# Patient Record
Sex: Male | Born: 1970 | Race: White | Hispanic: No | Marital: Single | Smoking: Never smoker
Health system: Southern US, Community
[De-identification: ages and names within clinical notes are randomized; demographics above are authoritative.]

## PROBLEM LIST (undated history)

## (undated) DIAGNOSIS — K5792 Diverticulitis of intestine, part unspecified, without perforation or abscess without bleeding: Secondary | ICD-10-CM

## (undated) DIAGNOSIS — E785 Hyperlipidemia, unspecified: Secondary | ICD-10-CM

## (undated) DIAGNOSIS — Z801 Family history of malignant neoplasm of trachea, bronchus and lung: Secondary | ICD-10-CM

## (undated) HISTORY — DX: Family history of malignant neoplasm of trachea, bronchus and lung: Z80.1

---

## 1999-06-20 HISTORY — PX: COLON SURGERY: SHX602

## 2015-08-31 ENCOUNTER — Encounter (HOSPITAL_COMMUNITY): Payer: Self-pay | Admitting: *Deleted

## 2015-08-31 MED ORDER — CEFAZOLIN SODIUM-DEXTROSE 2-3 GM-% IV SOLR: 2.0000 g | INTRAVENOUS | Status: DC

## 2015-09-01 ENCOUNTER — Encounter (HOSPITAL_COMMUNITY): Payer: Self-pay | Admitting: *Deleted

## 2015-09-01 ENCOUNTER — Ambulatory Visit (HOSPITAL_COMMUNITY)
Admission: RE | Admit: 2015-09-01 | Discharge: 2015-09-01 | Disposition: A | Discharge status: Home / Self Care | Payer: BLUE CROSS/BLUE SHIELD | Source: Ambulatory Visit | Attending: Orthopedic Surgery | Admitting: Orthopedic Surgery

## 2015-09-01 ENCOUNTER — Ambulatory Visit (HOSPITAL_COMMUNITY): Payer: BLUE CROSS/BLUE SHIELD | Admitting: Anesthesiology

## 2015-09-01 ENCOUNTER — Encounter (HOSPITAL_COMMUNITY)
Admission: RE | Disposition: A | Discharge status: Home / Self Care | Payer: Self-pay | Source: Ambulatory Visit | Attending: Orthopedic Surgery

## 2015-09-01 DIAGNOSIS — X58XXXA Exposure to other specified factors, initial encounter: Secondary | ICD-10-CM | POA: Diagnosis not present

## 2015-09-01 DIAGNOSIS — Z79899 Other long term (current) drug therapy: Secondary | ICD-10-CM | POA: Insufficient documentation

## 2015-09-01 DIAGNOSIS — S62212A Bennett's fracture, left hand, initial encounter for closed fracture: Secondary | ICD-10-CM | POA: Diagnosis present

## 2015-09-01 DIAGNOSIS — E785 Hyperlipidemia, unspecified: Secondary | ICD-10-CM | POA: Diagnosis not present

## 2015-09-01 HISTORY — DX: Hyperlipidemia, unspecified: E78.5

## 2015-09-01 HISTORY — PX: CLOSED REDUCTION FINGER WITH PERCUTANEOUS PINNING: SHX5612

## 2015-09-01 HISTORY — DX: Diverticulitis of intestine, part unspecified, without perforation or abscess without bleeding: K57.92

## 2015-09-01 SURGERY — CLOSED REDUCTION, FINGER, WITH PERCUTANEOUS PINNING
Anesthesia: General | Site: Thumb | Laterality: Left

## 2015-09-01 MED ORDER — HYDROMORPHONE HCL 1 MG/ML IJ SOLN: 0.2500 mg | INTRAMUSCULAR | Status: DC | PRN

## 2015-09-01 MED ORDER — FENTANYL CITRATE (PF) 250 MCG/5ML IJ SOLN
INTRAMUSCULAR | Status: AC
  Filled 2015-09-01: qty 5

## 2015-09-01 MED ORDER — CEFAZOLIN SODIUM-DEXTROSE 2-3 GM-% IV SOLR
INTRAVENOUS | Status: DC | PRN
  Administered 2015-09-01: 2 g via INTRAVENOUS

## 2015-09-01 MED ORDER — FENTANYL CITRATE (PF) 100 MCG/2ML IJ SOLN
INTRAMUSCULAR | Status: DC | PRN
  Administered 2015-09-01 (×2): 50 ug via INTRAVENOUS
  Administered 2015-09-01 (×2): 25 ug via INTRAVENOUS
  Administered 2015-09-01: 50 ug via INTRAVENOUS
  Administered 2015-09-01 (×2): 25 ug via INTRAVENOUS

## 2015-09-01 MED ORDER — PROMETHAZINE HCL 25 MG/ML IJ SOLN: 6.2500 mg | INTRAMUSCULAR | Status: DC | PRN

## 2015-09-01 MED ORDER — PROPOFOL 10 MG/ML IV BOLUS
INTRAVENOUS | Status: DC | PRN
  Administered 2015-09-01: 200 mg via INTRAVENOUS

## 2015-09-01 MED ORDER — ONDANSETRON HCL 4 MG/2ML IJ SOLN
INTRAMUSCULAR | Status: AC
  Filled 2015-09-01: qty 2

## 2015-09-01 MED ORDER — LACTATED RINGERS IV SOLN
INTRAVENOUS | Status: DC | PRN
  Administered 2015-09-01 (×2): via INTRAVENOUS

## 2015-09-01 MED ORDER — PHENYLEPHRINE HCL 10 MG/ML IJ SOLN
INTRAMUSCULAR | Status: DC | PRN
  Administered 2015-09-01: 40 ug via INTRAVENOUS
  Administered 2015-09-01: 80 ug via INTRAVENOUS
  Administered 2015-09-01 (×7): 40 ug via INTRAVENOUS

## 2015-09-01 MED ORDER — HYDROMORPHONE HCL 2 MG PO TABS: 2.0000 mg | ORAL_TABLET | ORAL | Status: DC | PRN

## 2015-09-01 MED ORDER — MIDAZOLAM HCL 2 MG/2ML IJ SOLN
INTRAMUSCULAR | Status: AC
  Filled 2015-09-01: qty 2

## 2015-09-01 MED ORDER — 0.9 % SODIUM CHLORIDE (POUR BTL) OPTIME
TOPICAL | Status: DC | PRN
  Administered 2015-09-01: 1000 mL

## 2015-09-01 MED ORDER — MEPERIDINE HCL 25 MG/ML IJ SOLN: 6.2500 mg | INTRAMUSCULAR | Status: DC | PRN

## 2015-09-01 MED ORDER — LACTATED RINGERS IV SOLN: INTRAVENOUS | Status: DC

## 2015-09-01 MED ORDER — BUPIVACAINE-EPINEPHRINE (PF) 0.5% -1:200000 IJ SOLN
INTRAMUSCULAR | Status: DC | PRN
  Administered 2015-09-01: 30 mL via PERINEURAL

## 2015-09-01 MED ORDER — PHENYLEPHRINE 40 MCG/ML (10ML) SYRINGE FOR IV PUSH (FOR BLOOD PRESSURE SUPPORT)
PREFILLED_SYRINGE | INTRAVENOUS | Status: AC
  Filled 2015-09-01: qty 10

## 2015-09-01 MED ORDER — CHLORHEXIDINE GLUCONATE 4 % EX LIQD: 60.0000 mL | Freq: Once | CUTANEOUS | Status: DC

## 2015-09-01 MED ORDER — PROPOFOL 10 MG/ML IV BOLUS
INTRAVENOUS | Status: AC
  Filled 2015-09-01: qty 20

## 2015-09-01 MED ORDER — LIDOCAINE HCL (CARDIAC) 20 MG/ML IV SOLN
INTRAVENOUS | Status: DC | PRN
  Administered 2015-09-01: 60 mg via INTRAVENOUS

## 2015-09-01 MED ORDER — CEFAZOLIN SODIUM-DEXTROSE 2-3 GM-% IV SOLR
INTRAVENOUS | Status: AC
  Filled 2015-09-01: qty 50

## 2015-09-01 MED ORDER — ONDANSETRON HCL 4 MG/2ML IJ SOLN
INTRAMUSCULAR | Status: DC | PRN
  Administered 2015-09-01: 4 mg via INTRAVENOUS

## 2015-09-01 MED ORDER — MIDAZOLAM HCL 2 MG/2ML IJ SOLN
INTRAMUSCULAR | Status: DC | PRN
  Administered 2015-09-01: 2 mg via INTRAVENOUS

## 2015-09-01 SURGICAL SUPPLY — 46 items
BANDAGE ACE 3X5.8 VEL STRL LF (GAUZE/BANDAGES/DRESSINGS) ×2 IMPLANT
BANDAGE ACE 4X5 VEL STRL LF (GAUZE/BANDAGES/DRESSINGS) ×2 IMPLANT
BIT DRILL 1.0 (BIT) ×2
BIT DRILL 1.0X50 (BIT) ×1 IMPLANT
BIT DRILL 1.3 (BIT) ×1
BIT DRILL 1.3MM (BIT) ×1 IMPLANT
BLADE SURG ROTATE 9660 (MISCELLANEOUS) IMPLANT
COVER SURGICAL LIGHT HANDLE (MISCELLANEOUS) ×2 IMPLANT
CUFF TOURNIQUET SINGLE 24IN (TOURNIQUET CUFF) ×2 IMPLANT
DRAPE OEC MINIVIEW 54X84 (DRAPES) ×2 IMPLANT
DRILL BIT 1.3MM (BIT) ×1
DRSG EMULSION OIL 3X3 NADH (GAUZE/BANDAGES/DRESSINGS) ×2 IMPLANT
GAUZE SPONGE 4X4 12PLY STRL (GAUZE/BANDAGES/DRESSINGS) ×2 IMPLANT
GAUZE XEROFORM 1X8 LF (GAUZE/BANDAGES/DRESSINGS) ×2 IMPLANT
GLOVE BIO SURGEON STRL SZ8 (GLOVE) ×2 IMPLANT
GLOVE BIOGEL PI IND STRL 8 (GLOVE) ×1 IMPLANT
GLOVE BIOGEL PI IND STRL 8.5 (GLOVE) ×1 IMPLANT
GLOVE BIOGEL PI INDICATOR 8 (GLOVE) ×1
GLOVE BIOGEL PI INDICATOR 8.5 (GLOVE) ×1
GLOVE SURG ORTHO 8.0 STRL STRW (GLOVE) ×2 IMPLANT
GOWN STRL REUS W/ TWL LRG LVL3 (GOWN DISPOSABLE) ×2 IMPLANT
GOWN STRL REUS W/ TWL XL LVL3 (GOWN DISPOSABLE) ×1 IMPLANT
GOWN STRL REUS W/TWL LRG LVL3 (GOWN DISPOSABLE) ×2
GOWN STRL REUS W/TWL XL LVL3 (GOWN DISPOSABLE) ×1
K-WIRE .045 CH (WIRE) ×4
K-WIRE 6 .028 (WIRE) ×4
K-WIRE THREADED .035X9 (WIRE) ×6 IMPLANT
KIT BASIN OR (CUSTOM PROCEDURE TRAY) ×2 IMPLANT
KIT ROOM TURNOVER OR (KITS) ×2 IMPLANT
KWIRE .045 CH (WIRE) ×2 IMPLANT
KWIRE 6 .028 (WIRE) ×2 IMPLANT
NS IRRIG 1000ML POUR BTL (IV SOLUTION) ×2 IMPLANT
PACK ORTHO EXTREMITY (CUSTOM PROCEDURE TRAY) ×2 IMPLANT
PAD ARMBOARD 7.5X6 YLW CONV (MISCELLANEOUS) ×4 IMPLANT
PAD CAST 4YDX4 CTTN HI CHSV (CAST SUPPLIES) ×1 IMPLANT
PADDING CAST COTTON 4X4 STRL (CAST SUPPLIES) ×1
PADDING UNDERCAST 2 STRL (CAST SUPPLIES) ×1
PADDING UNDERCAST 2X4 STRL (CAST SUPPLIES) ×1 IMPLANT
SCREW 1.3X18MM (Screw) ×2 IMPLANT
SPLINT FIBERGLASS 4X30 (CAST SUPPLIES) ×2 IMPLANT
SUT PROLENE 4 0 P 3 18 (SUTURE) ×2 IMPLANT
SUT VIC AB 2-0 FS1 27 (SUTURE) ×2 IMPLANT
SUT VICRYL 4-0 PS2 18IN ABS (SUTURE) ×2 IMPLANT
TOWEL OR 17X24 6PK STRL BLUE (TOWEL DISPOSABLE) ×2 IMPLANT
TOWEL OR 17X26 10 PK STRL BLUE (TOWEL DISPOSABLE) ×2 IMPLANT
WATER STERILE IRR 1000ML POUR (IV SOLUTION) ×2 IMPLANT

## 2015-09-01 NOTE — Op Note (Signed)
NAMLavina Hamman:  Curtis Pena, Curtis Pena              ACCOUNT NO.:  [id number]  MEDICAL RECORD NO.:  [id number]  LOCATION:  [facility]                         FACILITY:  [facility]  PHYSICIAN:  Sharma CovertFred W. Diaz Crago IV, M.D.DATE OF BIRTH:  22-Jan-1971  DATE OF PROCEDURE:  09/01/2015 DATE OF DISCHARGE:  09/01/2015                              OPERATIVE REPORT   PREOPERATIVE DIAGNOSIS:  Left thumb Bennett fracture dislocation of the thumb, carpometacarpal joint.  POSTOPERATIVE DIAGNOSIS:  Left thumb Bennett fracture dislocation of the thumb, carpometacarpal joint  ATTENDING SURGEON:  Sharma CovertFred W. Ramin Zoll, M.D., who scrubbed and present for the entire procedure.  ASSISTANT SURGEON:  Leilani AbleSteve Chabon, PA-C, who scrubbed and necessary for the entire procedure for the open reduction and careful meticulous internal fixation.  ANESTHESIA:  General with supraclavicular block.  SURGICAL IMPLANTS:  Two 0.045 K-wires and two 0.035 K-wires.  SURGICAL INDICATIONS:  Mr. Judie GrieveBryan is a right-hand-dominant gentleman, who sustaining a closed injury to the thumb basilar joint.  He was recommended to undergo the above procedure.  Risks, benefits, and alternatives were discussed in detail with the patient.  Signed informed consent was obtained.  Risks include, but not limited to bleeding; infection; damage to nearby nerves, arteries, or tendons; loss of motion of wrist and digits; incomplete relief of symptoms; and need for further surgical intervention.  DESCRIPTION OF PROCEDURE:  The patient was properly identified in the preoperative holding area, marked with a permanent marker made on the left thumb to indicate correct operative site.  The patient was brought back to the operating room, placed supine on anesthesia table.  General anesthesia was administered.  The patient tolerated this well.  A well- padded tourniquet was placed on left brachium, sealed with 1000 drape. Left upper extremity was then prepped and draped in normal  sterile fashion.  Time-out was called.  The correct site was identified, and procedure then begun.  Attention then turned to left thumb, where a Wagner incision type incision made directly over the thumb glabrous border.  Dissection was carried down through the skin and subcutaneous tissue and tourniquet insufflated.  The thumb thenar musculature was carefully dissected free all the way down to the joint capsule.  The joint capsule was incised longitudinally exposing the fracture site. This was a highly comminuted fracture, more than 3 or more fragments of the articular surface displaced, very small fragments.  Joint irrigation was done with small curettes and then small instrumentation was used to remove the fracture hematoma and reduce the small articular pieces. Careful meticulous dissection was then done to try to protect the articular pieces.  The joint was then stabilized with two 0.045 K-wires which showed good congruency of the joint.  This patient's main large articular fragment that I was unable to place, it was very small and careful about placing screws, therefore two 0.035 K-wires were placed volarly in dorsal direction and out of the skin dorsally.  The final construct was completed and final radiographs were obtained.  The K-wire was then cut and bent, left out of the skin.  The joint capsule was then closed with 2-0 Vicryl.  Thenar muscular fascia was closed with 2-0 Vicryl.  Subcutaneous tissues were closed with  4-0 Vicryl, and the skin was closed with 4-0 Prolene.  Adaptic dressing and Xeroform was applied around the pin sites.  Adaptic dressing, sterile compressive bandage then applied.  The patient was placed in a well-padded thumb spica splint, extubated, and taken to recovery room in good condition.  Intraoperative radiographs; AP, lateral, and oblique views of the thumb did show the K-wire fixation in place.  There was good congruency of the joint on the  hyperpronated views.  The patient does have very mild articular diastasis with the K-wire fixation in place.  POSTPROCEDURE PLAN:  The patient will be discharged to home, seen back in office in approximately 12 days, pin check, suture removal, x-rays, application of short-arm thumb spica cast for a total of 5-1/2 weeks with pin immobilization.  Radiographs at the 2-week mark, 4-week mark, and at the 5-week mark.  K-wires out at around the 5 to 6-week mark and then begin an outpatient therapy regimen.  The patient did have a highly comminuted and multiple fragments of articular surfaces, the goal was to restore the joint congruity knowing that the patient most likely will have some posttraumatic arthrosis within the joint given the highly comminuted fracture.     Madelynn Done, M.D.     FWO/MEDQ  D:  09/01/2015  T:  09/01/2015  Job:  [id number]

## 2015-09-01 NOTE — Brief Op Note (Signed)
09/01/2015  5:23 PM  PATIENT:  Curtis Pena  45 y.o. male  PRE-OPERATIVE DIAGNOSIS:  LEFT THUMB BENNETT FRACTURE  POST-OPERATIVE DIAGNOSIS:  LEFT THUMB BENNETT FRACTURE  PROCEDURE:  Procedure(s): LEFT THUMB CLOSED REDUCTION AND PINNING AND POSSIBLE ORIF (Left)  SURGEON:  Surgeon(s) and Role:    * Bradly BienenstockFred Rahkeem Senft, MD - Primary  PHYSICIAN ASSISTANT:   ASSISTANTS: CHABON PAC   ANESTHESIA:   general  EBL:     BLOOD ADMINISTERED:none  DRAINS: none   LOCAL MEDICATIONS USED:  MARCAINE     SPECIMEN:  No Specimen  DISPOSITION OF SPECIMEN:  N/A  COUNTS:  YES  TOURNIQUET:    DICTATION: .Other Dictation: Dictation Number [id number]  PLAN OF CARE: Discharge to home after PACU  PATIENT DISPOSITION:  PACU - hemodynamically stable.   Delay start of Pharmacological VTE agent (>24hrs) due to surgical blood loss or risk of bleeding: not applicable

## 2015-09-01 NOTE — Anesthesia Procedure Notes (Addendum)
Anesthesia Regional Block:  Supraclavicular block  Pre-Anesthetic Checklist: ,, timeout performed, Correct Patient, Correct Site, Correct Laterality, Correct Procedure, Correct Position, site marked, Risks and benefits discussed,  Surgical consent,  Pre-op evaluation,  At surgeon's request and post-op pain management  Laterality: Left  Prep: chloraprep       Needles:  Injection technique: Single-shot  Needle Type: Echogenic Needle     Needle Length: 9cm 9 cm Needle Gauge: 21 and 21 G    Additional Needles:  Procedures: ultrasound guided (picture in chart) Supraclavicular block Narrative:  Start time: 09/01/2015 5:15 AM End time: 09/01/2015 5:18 AM Injection made incrementally with aspirations every 5 mL.  Performed by: Personally  Anesthesiologist: Shona SimpsonHOLLIS, KEVIN D  Additional Notes: No immediate complications noted. Tolerated well.    Procedure Name: LMA Insertion Date/Time: 09/01/2015 5:31 PM Performed by: Rise PatienceBELL, Xeng Kucher T Pre-anesthesia Checklist: Patient identified, Emergency Drugs available, Suction available and Patient being monitored Patient Re-evaluated:Patient Re-evaluated prior to inductionOxygen Delivery Method: Circle system utilized Preoxygenation: Pre-oxygenation with 100% oxygen Intubation Type: IV induction LMA: LMA inserted LMA Size: 5.0 Number of attempts: 1 Placement Confirmation: positive ETCO2 and breath sounds checked- equal and bilateral Tube secured with: Tape Dental Injury: Teeth and Oropharynx as per pre-operative assessment

## 2015-09-01 NOTE — H&P (Signed)
Archie Endougustin Ridings is an 45 y.o. male.   Chief Complaint: left thumb fracture, dislocation HPI: Pt sustained closed left thumb fracture dislocation after skiing Pt presented to office with displaced fracture No prior surgery to left thumb   Past Medical History  Diagnosis Date  . Diverticulitis   . Hyperlipemia     Past Surgical History  Procedure Laterality Date  . Colon surgery  2001    Sigmoid colon resection    History reviewed. No pertinent family history. Social History:  reports that he has never smoked. He has never used smokeless tobacco. He reports that he does not drink alcohol or use illicit drugs.  Allergies: No Known Allergies  Medications Prior to Admission  Medication Sig Dispense Refill  . atorvastatin (LIPITOR) 40 MG tablet Take 40 mg by mouth daily.    . cholecalciferol (VITAMIN D) 1000 units tablet Take 2,000 Units by mouth daily.    Marland Kitchen. FIBER COMPLETE PO Take 5 capsules by mouth daily.    Marland Kitchen. HYDROmorphone (DILAUDID) 2 MG tablet Take 2 mg by mouth every 4 (four) hours as needed for moderate pain or severe pain.    . Omega-3 Fatty Acids (FISH OIL) 1000 MG CAPS Take 1 capsule by mouth daily.    Marland Kitchen. zolpidem (AMBIEN CR) 12.5 MG CR tablet Take 12.5 mg by mouth at bedtime as needed for sleep.      No results found for this or any previous visit (from the past 48 hour(s)). No results found.  ROS NO RECENT ILLNESSES OR HOSPITALIZATIONS  Blood pressure 135/98, pulse 67, temperature 98 F (36.7 C), temperature source Oral, resp. rate 18, height 6\' 2"  (1.88 m), weight 92.987 kg (205 lb), SpO2 99 %. Physical Exam  General Appearance:  Alert, cooperative, no distress, appears stated age  Head:  Normocephalic, without obvious abnormality, atraumatic  Eyes:  Pupils equal, conjunctiva/corneas clear,         Throat: Lips, mucosa, and tongue normal; teeth and gums normal  Neck: No visible masses     Lungs:   respirations unlabored  Chest Wall:  No tenderness or deformity   Heart:  Regular rate and rhythm,  Abdomen:   Soft, non-tender,         Extremities: LEFT THUMB : SKIN INTACT,FINGERS WARM WELL PERFUSED ABLE TO EXTEND THUMB AND FINGERS GOOD WRIST AND FOREARM MOTION  Pulses: 2+ and symmetric  Skin: Skin color, texture, turgor normal, no rashes or lesions     Neurologic: Normal   Assessment/Plan LEFT THUMB BENNETTS FRACTURE/DISLOCATION  LEFT THUMB CLOSED REDUCTION AND PINNING POSSIBLE OPEN REDUCTION AND INTERNAL FIXATION  R/B/A DISCUSSED WITH PT IN OFFICE.  PT VOICED UNDERSTANDING OF PLAN CONSENT SIGNED DAY OF SURGERY PT SEEN AND EXAMINED PRIOR TO OPERATIVE PROCEDURE/DAY OF SURGERY SITE MARKED. QUESTIONS ANSWERED WILL GO HOME FOLLOWING SURGERY  WE ARE PLANNING SURGERY FOR YOUR UPPER EXTREMITY. THE RISKS AND BENEFITS OF SURGERY INCLUDE BUT NOT LIMITED TO BLEEDING INFECTION, DAMAGE TO NEARBY NERVES ARTERIES TENDONS, FAILURE OF SURGERY TO ACCOMPLISH ITS INTENDED GOALS, PERSISTENT SYMPTOMS AND NEED FOR FURTHER SURGICAL INTERVENTION. WITH THIS IN MIND WE WILL PROCEED. I HAVE DISCUSSED WITH THE PATIENT THE PRE AND POSTOPERATIVE REGIMEN AND THE DOS AND DON'TS. PT VOICED UNDERSTANDING AND INFORMED CONSENT SIGNED.  Sharma CovertORTMANN,Carsyn Taubman W 09/01/2015, 5:20 PM

## 2015-09-01 NOTE — Transfer of Care (Signed)
Immediate Anesthesia Transfer of Care Note  Patient: Curtis Pena  Procedure(s) Performed: Procedure(s): ORIF LEFT THUMB (Left)  Patient Location: PACU  Anesthesia Type:GA combined with regional for post-op pain  Level of Consciousness: awake, alert  and oriented  Airway & Oxygen Therapy: Patient connected to nasal cannula oxygen  Post-op Assessment: Report given to RN and Post -op Vital signs reviewed and stable  Post vital signs: Reviewed and stable  Last Vitals:  Filed Vitals:   09/01/15 1657 09/01/15 1951  BP: 135/98 116/84  Pulse: 67 75  Temp: 36.7 C 36 C  Resp: 18     Complications: No apparent anesthesia complications

## 2015-09-01 NOTE — Anesthesia Preprocedure Evaluation (Addendum)
Anesthesia Evaluation  Patient identified by MRN, date of birth, ID band Patient awake    Reviewed: Allergy & Precautions, NPO status , Patient's Chart, lab work & pertinent test results  Airway Mallampati: II  TM Distance: >3 FB Neck ROM: Full    Dental  (+) Teeth Intact, Dental Advisory Given   Pulmonary neg pulmonary ROS,    breath sounds clear to auscultation       Cardiovascular negative cardio ROS   Rhythm:Regular Rate:Normal     Neuro/Psych negative neurological ROS  negative psych ROS   GI/Hepatic negative GI ROS, Neg liver ROS,   Endo/Other  negative endocrine ROS  Renal/GU negative Renal ROS  negative genitourinary   Musculoskeletal negative musculoskeletal ROS (+)   Abdominal Normal abdominal exam  (+)   Peds negative pediatric ROS (+)  Hematology negative hematology ROS (+)   Anesthesia Other Findings   Reproductive/Obstetrics negative OB ROS                           Anesthesia Physical Anesthesia Plan  ASA: II  Anesthesia Plan: General   Post-op Pain Management: GA combined w/ Regional for post-op pain   Induction: Intravenous  Airway Management Planned: LMA  Additional Equipment:   Intra-op Plan:   Post-operative Plan: Extubation in OR  Informed Consent: I have reviewed the patients History and Physical, chart, labs and discussed the procedure including the risks, benefits and alternatives for the proposed anesthesia with the patient or authorized representative who has indicated his/her understanding and acceptance.   Dental advisory given  Plan Discussed with: CRNA  Anesthesia Plan Comments:        Anesthesia Quick Evaluation

## 2015-09-01 NOTE — Discharge Instructions (Signed)
KEEP BANDAGE CLEAN AND DRY °CALL OFFICE FOR F/U APPT [phone] IN 10 DAYS °DR Kirra Verga CELL [phone] °KEEP HAND ELEVATED ABOVE HEART °OK TO APPLY ICE TO OPERATIVE AREA °CONTACT OFFICE IF ANY WORSENING PAIN OR CONCERNS. °

## 2015-09-02 ENCOUNTER — Encounter (HOSPITAL_COMMUNITY): Payer: Self-pay | Admitting: Orthopedic Surgery

## 2015-09-03 NOTE — Anesthesia Postprocedure Evaluation (Signed)
Anesthesia Post Note  Patient: Archie Endougustin Franzen  Procedure(s) Performed: Procedure(s) (LRB): ORIF LEFT THUMB (Left)  Patient location during evaluation: PACU Anesthesia Type: General and Regional Level of consciousness: awake Pain management: pain level controlled Vital Signs Assessment: post-procedure vital signs reviewed and stable Respiratory status: spontaneous breathing and respiratory function stable Cardiovascular status: stable Postop Assessment: no signs of nausea or vomiting Anesthetic complications: no    Last Vitals:  Filed Vitals:   09/01/15 1951 09/01/15 2005  BP: 116/84 117/80  Pulse: 75 69  Temp: 36 C 36.1 C  Resp:  16    Last Pain:  Filed Vitals:   09/01/15 2006  PainSc: 0-No pain                 Fin Hupp

## 2015-09-27 DIAGNOSIS — S62212D Bennett's fracture, left hand, subsequent encounter for fracture with routine healing: Secondary | ICD-10-CM | POA: Diagnosis not present

## 2015-10-11 DIAGNOSIS — S62212D Bennett's fracture, left hand, subsequent encounter for fracture with routine healing: Secondary | ICD-10-CM | POA: Diagnosis not present

## 2015-11-01 DIAGNOSIS — S62212D Bennett's fracture, left hand, subsequent encounter for fracture with routine healing: Secondary | ICD-10-CM | POA: Diagnosis not present

## 2015-12-31 DIAGNOSIS — Z4789 Encounter for other orthopedic aftercare: Secondary | ICD-10-CM | POA: Diagnosis not present

## 2015-12-31 DIAGNOSIS — S62212D Bennett's fracture, left hand, subsequent encounter for fracture with routine healing: Secondary | ICD-10-CM | POA: Diagnosis not present

## 2016-03-28 DIAGNOSIS — Z23 Encounter for immunization: Secondary | ICD-10-CM | POA: Diagnosis not present

## 2016-05-29 DIAGNOSIS — M79674 Pain in right toe(s): Secondary | ICD-10-CM | POA: Diagnosis not present

## 2016-05-29 DIAGNOSIS — S92414A Nondisplaced fracture of proximal phalanx of right great toe, initial encounter for closed fracture: Secondary | ICD-10-CM | POA: Diagnosis not present

## 2016-07-24 DIAGNOSIS — M25561 Pain in right knee: Secondary | ICD-10-CM | POA: Diagnosis not present

## 2016-10-27 DIAGNOSIS — R5383 Other fatigue: Secondary | ICD-10-CM | POA: Diagnosis not present

## 2016-10-27 DIAGNOSIS — Z125 Encounter for screening for malignant neoplasm of prostate: Secondary | ICD-10-CM | POA: Diagnosis not present

## 2016-10-27 DIAGNOSIS — E784 Other hyperlipidemia: Secondary | ICD-10-CM | POA: Diagnosis not present

## 2016-10-27 DIAGNOSIS — R3129 Other microscopic hematuria: Secondary | ICD-10-CM | POA: Diagnosis not present

## 2016-10-27 DIAGNOSIS — Z0000 Encounter for general adult medical examination without abnormal findings: Secondary | ICD-10-CM | POA: Diagnosis not present

## 2016-10-30 DIAGNOSIS — Z1389 Encounter for screening for other disorder: Secondary | ICD-10-CM | POA: Diagnosis not present

## 2016-10-30 DIAGNOSIS — E663 Overweight: Secondary | ICD-10-CM | POA: Diagnosis not present

## 2016-10-30 DIAGNOSIS — Z0000 Encounter for general adult medical examination without abnormal findings: Secondary | ICD-10-CM | POA: Diagnosis not present

## 2016-10-30 DIAGNOSIS — J3089 Other allergic rhinitis: Secondary | ICD-10-CM | POA: Diagnosis not present

## 2016-10-30 DIAGNOSIS — N528 Other male erectile dysfunction: Secondary | ICD-10-CM | POA: Diagnosis not present

## 2016-10-30 DIAGNOSIS — R3121 Asymptomatic microscopic hematuria: Secondary | ICD-10-CM | POA: Diagnosis not present

## 2017-01-18 DIAGNOSIS — M25561 Pain in right knee: Secondary | ICD-10-CM | POA: Diagnosis not present

## 2017-02-21 DIAGNOSIS — M545 Low back pain: Secondary | ICD-10-CM | POA: Diagnosis not present

## 2017-02-21 DIAGNOSIS — M9902 Segmental and somatic dysfunction of thoracic region: Secondary | ICD-10-CM | POA: Diagnosis not present

## 2017-02-21 DIAGNOSIS — M546 Pain in thoracic spine: Secondary | ICD-10-CM | POA: Diagnosis not present

## 2017-02-21 DIAGNOSIS — M542 Cervicalgia: Secondary | ICD-10-CM | POA: Diagnosis not present

## 2017-04-04 DIAGNOSIS — M9902 Segmental and somatic dysfunction of thoracic region: Secondary | ICD-10-CM | POA: Diagnosis not present

## 2017-04-04 DIAGNOSIS — M545 Low back pain: Secondary | ICD-10-CM | POA: Diagnosis not present

## 2017-04-04 DIAGNOSIS — M546 Pain in thoracic spine: Secondary | ICD-10-CM | POA: Diagnosis not present

## 2017-04-04 DIAGNOSIS — M542 Cervicalgia: Secondary | ICD-10-CM | POA: Diagnosis not present

## 2017-04-06 DIAGNOSIS — S81801A Unspecified open wound, right lower leg, initial encounter: Secondary | ICD-10-CM | POA: Diagnosis not present

## 2017-04-06 DIAGNOSIS — L03115 Cellulitis of right lower limb: Secondary | ICD-10-CM | POA: Diagnosis not present

## 2017-07-04 DIAGNOSIS — M545 Low back pain: Secondary | ICD-10-CM | POA: Diagnosis not present

## 2017-07-04 DIAGNOSIS — M9903 Segmental and somatic dysfunction of lumbar region: Secondary | ICD-10-CM | POA: Diagnosis not present

## 2017-07-04 DIAGNOSIS — M9904 Segmental and somatic dysfunction of sacral region: Secondary | ICD-10-CM | POA: Diagnosis not present

## 2017-07-04 DIAGNOSIS — M546 Pain in thoracic spine: Secondary | ICD-10-CM | POA: Diagnosis not present

## 2017-07-09 DIAGNOSIS — M25561 Pain in right knee: Secondary | ICD-10-CM | POA: Diagnosis not present

## 2017-07-09 DIAGNOSIS — M1711 Unilateral primary osteoarthritis, right knee: Secondary | ICD-10-CM | POA: Diagnosis not present

## 2017-07-09 DIAGNOSIS — M170 Bilateral primary osteoarthritis of knee: Secondary | ICD-10-CM | POA: Diagnosis not present

## 2017-08-13 DIAGNOSIS — M25561 Pain in right knee: Secondary | ICD-10-CM | POA: Diagnosis not present

## 2017-08-13 DIAGNOSIS — M1711 Unilateral primary osteoarthritis, right knee: Secondary | ICD-10-CM | POA: Diagnosis not present

## 2017-08-20 DIAGNOSIS — M1711 Unilateral primary osteoarthritis, right knee: Secondary | ICD-10-CM | POA: Diagnosis not present

## 2017-08-29 DIAGNOSIS — M1711 Unilateral primary osteoarthritis, right knee: Secondary | ICD-10-CM | POA: Diagnosis not present

## 2017-11-09 DIAGNOSIS — Z79891 Long term (current) use of opiate analgesic: Secondary | ICD-10-CM | POA: Diagnosis not present

## 2017-11-09 DIAGNOSIS — M47817 Spondylosis without myelopathy or radiculopathy, lumbosacral region: Secondary | ICD-10-CM | POA: Diagnosis not present

## 2017-12-10 DIAGNOSIS — M47817 Spondylosis without myelopathy or radiculopathy, lumbosacral region: Secondary | ICD-10-CM | POA: Diagnosis not present

## 2017-12-26 DIAGNOSIS — Z125 Encounter for screening for malignant neoplasm of prostate: Secondary | ICD-10-CM | POA: Diagnosis not present

## 2017-12-26 DIAGNOSIS — E7849 Other hyperlipidemia: Secondary | ICD-10-CM | POA: Diagnosis not present

## 2017-12-26 DIAGNOSIS — Z0000 Encounter for general adult medical examination without abnormal findings: Secondary | ICD-10-CM | POA: Diagnosis not present

## 2017-12-26 DIAGNOSIS — R3129 Other microscopic hematuria: Secondary | ICD-10-CM | POA: Diagnosis not present

## 2017-12-26 DIAGNOSIS — E785 Hyperlipidemia, unspecified: Secondary | ICD-10-CM | POA: Diagnosis not present

## 2017-12-28 DIAGNOSIS — Z1212 Encounter for screening for malignant neoplasm of rectum: Secondary | ICD-10-CM | POA: Diagnosis not present

## 2018-01-02 DIAGNOSIS — N528 Other male erectile dysfunction: Secondary | ICD-10-CM | POA: Diagnosis not present

## 2018-01-02 DIAGNOSIS — Z0000 Encounter for general adult medical examination without abnormal findings: Secondary | ICD-10-CM | POA: Diagnosis not present

## 2018-01-02 DIAGNOSIS — J3089 Other allergic rhinitis: Secondary | ICD-10-CM | POA: Diagnosis not present

## 2018-01-02 DIAGNOSIS — R7309 Other abnormal glucose: Secondary | ICD-10-CM | POA: Diagnosis not present

## 2018-01-02 DIAGNOSIS — Z1389 Encounter for screening for other disorder: Secondary | ICD-10-CM | POA: Diagnosis not present

## 2018-01-02 DIAGNOSIS — E663 Overweight: Secondary | ICD-10-CM | POA: Diagnosis not present

## 2018-01-09 DIAGNOSIS — M25561 Pain in right knee: Secondary | ICD-10-CM | POA: Diagnosis not present

## 2018-01-09 DIAGNOSIS — M1711 Unilateral primary osteoarthritis, right knee: Secondary | ICD-10-CM | POA: Diagnosis not present

## 2018-02-20 DIAGNOSIS — B070 Plantar wart: Secondary | ICD-10-CM | POA: Diagnosis not present

## 2018-03-05 DIAGNOSIS — B070 Plantar wart: Secondary | ICD-10-CM | POA: Diagnosis not present

## 2018-07-11 DIAGNOSIS — M1711 Unilateral primary osteoarthritis, right knee: Secondary | ICD-10-CM | POA: Diagnosis not present

## 2018-07-11 DIAGNOSIS — M25561 Pain in right knee: Secondary | ICD-10-CM | POA: Diagnosis not present

## 2018-12-26 DIAGNOSIS — R110 Nausea: Secondary | ICD-10-CM | POA: Diagnosis not present

## 2018-12-26 DIAGNOSIS — F329 Major depressive disorder, single episode, unspecified: Secondary | ICD-10-CM | POA: Diagnosis not present

## 2018-12-26 DIAGNOSIS — R251 Tremor, unspecified: Secondary | ICD-10-CM | POA: Diagnosis not present

## 2018-12-26 DIAGNOSIS — F1110 Opioid abuse, uncomplicated: Secondary | ICD-10-CM | POA: Diagnosis not present

## 2018-12-26 DIAGNOSIS — F1910 Other psychoactive substance abuse, uncomplicated: Secondary | ICD-10-CM | POA: Diagnosis not present

## 2019-01-21 DIAGNOSIS — F1199 Opioid use, unspecified with unspecified opioid-induced disorder: Secondary | ICD-10-CM | POA: Diagnosis not present

## 2019-01-21 DIAGNOSIS — F4322 Adjustment disorder with anxiety: Secondary | ICD-10-CM | POA: Diagnosis not present

## 2019-05-12 DIAGNOSIS — Z119 Encounter for screening for infectious and parasitic diseases, unspecified: Secondary | ICD-10-CM | POA: Diagnosis not present

## 2019-09-15 DIAGNOSIS — Z23 Encounter for immunization: Secondary | ICD-10-CM | POA: Diagnosis not present

## 2019-10-04 DIAGNOSIS — Z23 Encounter for immunization: Secondary | ICD-10-CM | POA: Diagnosis not present

## 2020-04-07 DIAGNOSIS — F1120 Opioid dependence, uncomplicated: Secondary | ICD-10-CM | POA: Diagnosis not present

## 2020-04-21 DIAGNOSIS — F1120 Opioid dependence, uncomplicated: Secondary | ICD-10-CM | POA: Diagnosis not present

## 2020-05-05 DIAGNOSIS — F1120 Opioid dependence, uncomplicated: Secondary | ICD-10-CM | POA: Diagnosis not present

## 2020-05-17 DIAGNOSIS — F1120 Opioid dependence, uncomplicated: Secondary | ICD-10-CM | POA: Diagnosis not present

## 2020-05-19 DIAGNOSIS — F1120 Opioid dependence, uncomplicated: Secondary | ICD-10-CM | POA: Diagnosis not present

## 2020-06-02 DIAGNOSIS — F1120 Opioid dependence, uncomplicated: Secondary | ICD-10-CM | POA: Diagnosis not present

## 2020-06-16 DIAGNOSIS — F1120 Opioid dependence, uncomplicated: Secondary | ICD-10-CM | POA: Diagnosis not present

## 2020-06-30 DIAGNOSIS — F1120 Opioid dependence, uncomplicated: Secondary | ICD-10-CM | POA: Diagnosis not present

## 2020-07-08 DIAGNOSIS — M25561 Pain in right knee: Secondary | ICD-10-CM | POA: Diagnosis not present

## 2020-07-28 DIAGNOSIS — F1120 Opioid dependence, uncomplicated: Secondary | ICD-10-CM | POA: Diagnosis not present

## 2020-07-29 DIAGNOSIS — E785 Hyperlipidemia, unspecified: Secondary | ICD-10-CM | POA: Diagnosis not present

## 2020-07-29 DIAGNOSIS — Z0000 Encounter for general adult medical examination without abnormal findings: Secondary | ICD-10-CM | POA: Diagnosis not present

## 2020-07-29 DIAGNOSIS — E663 Overweight: Secondary | ICD-10-CM | POA: Diagnosis not present

## 2020-07-29 DIAGNOSIS — E739 Lactose intolerance, unspecified: Secondary | ICD-10-CM | POA: Diagnosis not present

## 2020-08-02 DIAGNOSIS — Z23 Encounter for immunization: Secondary | ICD-10-CM | POA: Diagnosis not present

## 2020-08-02 DIAGNOSIS — E785 Hyperlipidemia, unspecified: Secondary | ICD-10-CM | POA: Diagnosis not present

## 2020-08-02 DIAGNOSIS — Z0000 Encounter for general adult medical examination without abnormal findings: Secondary | ICD-10-CM | POA: Diagnosis not present

## 2020-08-03 ENCOUNTER — Other Ambulatory Visit: Payer: Self-pay | Admitting: Internal Medicine

## 2020-08-03 DIAGNOSIS — E785 Hyperlipidemia, unspecified: Secondary | ICD-10-CM

## 2020-08-11 DIAGNOSIS — F1120 Opioid dependence, uncomplicated: Secondary | ICD-10-CM | POA: Diagnosis not present

## 2020-08-25 DIAGNOSIS — F1120 Opioid dependence, uncomplicated: Secondary | ICD-10-CM | POA: Diagnosis not present

## 2020-08-30 DIAGNOSIS — F1120 Opioid dependence, uncomplicated: Secondary | ICD-10-CM | POA: Diagnosis not present

## 2020-09-09 DIAGNOSIS — F1120 Opioid dependence, uncomplicated: Secondary | ICD-10-CM | POA: Diagnosis not present

## 2020-09-27 DIAGNOSIS — Z1211 Encounter for screening for malignant neoplasm of colon: Secondary | ICD-10-CM | POA: Diagnosis not present

## 2020-10-08 ENCOUNTER — Ambulatory Visit
Admission: RE | Admit: 2020-10-08 | Discharge: 2020-10-08 | Disposition: A | Discharge status: Home / Self Care | Payer: No Typology Code available for payment source | Source: Ambulatory Visit | Attending: Internal Medicine | Admitting: Internal Medicine

## 2020-10-08 DIAGNOSIS — E785 Hyperlipidemia, unspecified: Secondary | ICD-10-CM | POA: Diagnosis not present

## 2020-10-18 ENCOUNTER — Ambulatory Visit: Payer: BC Managed Care – PPO | Admitting: Cardiovascular Disease

## 2020-10-20 DIAGNOSIS — F1120 Opioid dependence, uncomplicated: Secondary | ICD-10-CM | POA: Diagnosis not present

## 2020-11-03 DIAGNOSIS — F1120 Opioid dependence, uncomplicated: Secondary | ICD-10-CM | POA: Diagnosis not present

## 2020-11-05 ENCOUNTER — Ambulatory Visit: Payer: BC Managed Care – PPO | Admitting: Cardiology

## 2020-11-10 DIAGNOSIS — R931 Abnormal findings on diagnostic imaging of heart and coronary circulation: Secondary | ICD-10-CM | POA: Insufficient documentation

## 2020-11-10 NOTE — Progress Notes (Signed)
Cardiology Office Note   Date:  11/11/2020   ID:  Curtis Pena, DOB September 20, 1970, MRN [id number]  PCP:  Rodrigo Ran, MD  Cardiologist:   None Referring:  Rodrigo Ran, MD  Chief Complaint  Patient presents with  . Elevated Coronary Calcium Score      History of Present Illness: Curtis Pena is a 50 y.o. male who presents for evaluation of coronary calcium.  He was noted on recent screening to have a coronary calcium score of 1082.  Calcium was evenly distributed basically throughout all 3 vessels.  He has not had any prior cardiac history.  He says he had a distant stress test just for routine screening.  He is very physically active.  He exercises routinely.  He has an excellent diet.  He gets 10,000 steps a day.  He rides his bike.  He hikes.  He skis.  With all of this he denies any cardiovascular symptoms. The patient denies any new symptoms such as chest discomfort, neck or arm discomfort. There has been no new shortness of breath, PND or orthopnea. There have been no reported palpitations, presyncope or syncope.  He says that he has noticed his blood pressure being mildly elevated at times and wonders if this could be related to his stress does he has coffee drinks that he has been having.   Past Medical History:  Diagnosis Date  . Diverticulitis   . Hyperlipemia     Past Surgical History:  Procedure Laterality Date  . CLOSED REDUCTION FINGER WITH PERCUTANEOUS PINNING Left 09/01/2015   Procedure: ORIF LEFT THUMB;  Surgeon: Bradly Bienenstock, MD;  Location: [facility] OR;  Service: Orthopedics;  Laterality: Left;  . COLON SURGERY  2001   Sigmoid colon resection     Current Outpatient Medications  Medication Sig Dispense Refill  . Buprenorphine HCl-Naloxone HCl 8-2 MG FILM SMARTSIG:2-3 Strip(s) Sublingual Daily    . Calcium Polycarbophil (FIBER) 625 MG TABS one po tid    . cholecalciferol (VITAMIN D) 1000 units tablet Take 2,000 Units by mouth daily.    . diclofenac (VOLTAREN) 75  MG EC tablet Take 75 mg by mouth 2 (two) times daily as needed.    Marland Kitchen FIBER COMPLETE PO Take 5 capsules by mouth daily.    . Glucosamine-Chondroit-Vit C-Mn (GLUCOSAMINE 1500 COMPLEX PO) Glucosamine    . HYDROmorphone (DILAUDID) 2 MG tablet Take 1 tablet (2 mg total) by mouth every 4 (four) hours as needed for moderate pain or severe pain. 32 tablet 0  . Omega-3 Fatty Acids (FISH OIL) 1000 MG CAPS Take 1 capsule by mouth daily.    . Omega-3 Fatty Acids (FISH OIL) 1000 MG CAPS     . rosuvastatin (CRESTOR) 40 MG tablet Take 1 tablet (40 mg total) by mouth daily. 90 tablet 3   No current facility-administered medications for this visit.    Allergies:   Patient has no known allergies.    Social History:  The patient  reports that he has never smoked. He has never used smokeless tobacco. He reports that he does not drink alcohol and does not use drugs.   Family History:  The patient's family history includes Dementia in his mother; Lung cancer in his father.    ROS:  Please see the history of present illness.   Otherwise, review of systems are positive for none.   All other systems are reviewed and negative.    PHYSICAL EXAM: VS:  BP (!) 142/90   Pulse 65  Ht 6\' 2"  (1.88 m)   Wt 207 lb 6.4 oz (94.1 kg)   SpO2 99%   BMI 26.63 kg/m  , BMI Body mass index is 26.63 kg/m. GENERAL:  Well appearing HEENT:  Pupils equal round and reactive, fundi not visualized, oral mucosa unremarkable NECK:  No jugular venous distention, waveform within normal limits, carotid upstroke brisk and symmetric, no bruits, no thyromegaly LYMPHATICS:  No cervical, inguinal adenopathy LUNGS:  Clear to auscultation bilaterally BACK:  No CVA tenderness CHEST:  Unremarkable HEART:  PMI not displaced or sustained,S1 and S2 within normal limits, no S3, no S4, no clicks, no rubs, no murmurs ABD:  Flat, positive bowel sounds normal in frequency in pitch, no bruits, no rebound, no guarding, no midline pulsatile mass, no  hepatomegaly, no splenomegaly EXT:  2 plus pulses throughout, no edema, no cyanosis no clubbing SKIN:  No rashes no nodules NEURO:  Cranial nerves II through XII grossly intact, motor grossly intact throughout PSYCH:  Cognitively intact, oriented to person place and time    EKG:  EKG is ordered today. The ekg ordered today demonstrates sinus rhythm, rate 65, axis within normal limits, intervals within normal limits, no acute ST-T wave changes.    Recent Labs: No results found for requested labs within last 8760 hours.    Lipid Panel No results found for: CHOL, TRIG, HDL, CHOLHDL, VLDL, LDLCALC, LDLDIRECT    Wt Readings from Last 3 Encounters:  11/11/20 207 lb 6.4 oz (94.1 kg)  09/01/15 205 lb (93 kg)      Other studies Reviewed: Additional studies/ records that were reviewed today include: Coronary calcium score and labs. Review of the above records demonstrates:  Please see elsewhere in the note.     ASSESSMENT AND PLAN:  ELEVATED CORONARY CALCIUM SCORE.    We had a long discussion about this.  He has no symptoms.  Given the extent of his coronary calcium I like to screen him with a POET (Plain Old Exercise Treadmill).  I also think he needs very aggressive goals of therapy as below.  HTN: His blood pressure is borderline.  I will ask him to get a blood pressure cuff and check this a few times a week.  I think it is unlikely that he would need therapy.  I doubt that the coffee is contributing significantly.  DYSLIPIDEMIA: I think the goal LDL should be less than 70 and may be even per [place] guidelines in the 50s.  Toward that end I have stopped his Lipitor and start Crestor 40 mg daily.  He can come back in 3 months with a lipid profile and liver enzymes.  He is to persist with his excellent diet.   Current medicines are reviewed at length with the patient today.  The patient does not have concerns regarding medicines.  The following changes have been made:  no  change  Labs/ tests ordered today include:   Orders Placed This Encounter  Procedures  . Lipid panel  . Hepatic function panel  . EXERCISE TOLERANCE TEST (ETT)  . EKG 12-Lead     Disposition:   FU with in 3 months after his next lipid profile.   Signed, 09/03/15, MD  11/11/2020 5:56 PM    [facility]

## 2020-11-11 ENCOUNTER — Other Ambulatory Visit: Payer: Self-pay

## 2020-11-11 ENCOUNTER — Ambulatory Visit (INDEPENDENT_AMBULATORY_CARE_PROVIDER_SITE_OTHER): Payer: BC Managed Care – PPO | Admitting: Cardiology

## 2020-11-11 ENCOUNTER — Encounter: Payer: Self-pay | Admitting: Cardiology

## 2020-11-11 VITALS — BP 142/90 | HR 65 | Ht 74.0 in | Wt 207.4 lb

## 2020-11-11 DIAGNOSIS — R931 Abnormal findings on diagnostic imaging of heart and coronary circulation: Secondary | ICD-10-CM

## 2020-11-11 MED ORDER — ROSUVASTATIN CALCIUM 40 MG PO TABS: 40.0000 mg | ORAL_TABLET | Freq: Every day | ORAL | 3 refills | Status: DC

## 2020-11-11 NOTE — Patient Instructions (Signed)
Medication Instructions:  STOP: LIPITOR  START: CRESTOR 40mg  DAILY  *If you need a refill on your cardiac medications before your next appointment, please call your pharmacy*  Lab Work: LIPID/LIVER BLOOD WORK IN 3 MONTHS- PLEASE RETURN FOR THIS. YOU WILL NEED TO FAST PRIOR TO THIS If you have labs (blood work) drawn today and your tests are completely normal, you will receive your results only by: MyChart Message (if you have MyChart) OR . A paper copy in the mail If you have any lab test that is abnormal or we need to change your treatment, we will call you to review the results.  Testing/Procedures: Your physician has requested that you have an exercise tolerance test. For further information please visit Marland Kitchen. Please also follow instruction sheet, as given. This will take place at [address], [address].  Do not drink or eat foods with caffeine for 24 hours before the test. (Chocolate, coffee, tea, or energy drinks)  If you use an inhaler, bring it with you to the test.  Do not smoke for 4 hours before the test.  Wear comfortable shoes and clothing.  Follow-Up: At [facility], you and your health needs are our priority.  As part of our continuing mission to provide you with exceptional heart care, we have created designated Provider Care Teams.  These Care Teams include your primary Cardiologist (physician) and Advanced Practice Providers (APPs -  Physician Assistants and Nurse Practitioners) who all work together to provide you with the care you need, when you need it.  We recommend signing up for the patient portal called "MyChart".  Sign up information is provided on this After Visit Summary.  MyChart is used to connect with patients for Virtual Visits (Telemedicine).  Patients are able to view lab/test results, encounter notes, upcoming appointments, etc.  Non-urgent messages can be sent to your provider as well.   To learn more about what you can do with  MyChart, go to [facility].    Your next appointment:   3 month(s)  The format for your next appointment:   In Person  Provider:   [email or web address], MD

## 2020-11-12 ENCOUNTER — Ambulatory Visit (HOSPITAL_COMMUNITY)
Admission: RE | Admit: 2020-11-12 | Discharge: 2020-11-12 | Disposition: A | Discharge status: Home / Self Care | Payer: BC Managed Care – PPO | Source: Ambulatory Visit | Attending: Cardiology | Admitting: Cardiology

## 2020-11-12 DIAGNOSIS — R931 Abnormal findings on diagnostic imaging of heart and coronary circulation: Secondary | ICD-10-CM | POA: Insufficient documentation

## 2020-11-12 LAB — EXERCISE TOLERANCE TEST
Estimated workload: 16.4 METS
Exercise duration (min): 13 min
Exercise duration (sec): 28 s
MPHR: 170 {beats}/min
Peak HR: 171 {beats}/min
Percent HR: 100 %
Rest HR: 69 {beats}/min

## 2020-11-17 DIAGNOSIS — F1120 Opioid dependence, uncomplicated: Secondary | ICD-10-CM | POA: Diagnosis not present

## 2020-12-01 DIAGNOSIS — F1120 Opioid dependence, uncomplicated: Secondary | ICD-10-CM | POA: Diagnosis not present

## 2020-12-13 DIAGNOSIS — F1120 Opioid dependence, uncomplicated: Secondary | ICD-10-CM | POA: Diagnosis not present

## 2020-12-15 DIAGNOSIS — F1120 Opioid dependence, uncomplicated: Secondary | ICD-10-CM | POA: Diagnosis not present

## 2020-12-29 DIAGNOSIS — F1120 Opioid dependence, uncomplicated: Secondary | ICD-10-CM | POA: Diagnosis not present

## 2021-01-12 DIAGNOSIS — F1121 Opioid dependence, in remission: Secondary | ICD-10-CM | POA: Diagnosis not present

## 2021-02-09 DIAGNOSIS — F1120 Opioid dependence, uncomplicated: Secondary | ICD-10-CM | POA: Diagnosis not present

## 2021-02-10 DIAGNOSIS — E785 Hyperlipidemia, unspecified: Secondary | ICD-10-CM | POA: Insufficient documentation

## 2021-02-10 DIAGNOSIS — I10 Essential (primary) hypertension: Secondary | ICD-10-CM | POA: Insufficient documentation

## 2021-02-10 NOTE — Progress Notes (Signed)
Cardiology Office Note   Date:  02/11/2021   ID:  Curtis Pena, DOB [birth year], MRN [id number]  PCP:  Rodrigo Ran, MD  Cardiologist:   None Referring:  Rodrigo Ran, MD   Chief Complaint  Patient presents with   Elevated Coronary Calcium       History of Present Illness: Curtis Pena is a 50 y.o. male who presents for evaluation of coronary calcium.  He was noted on recent screening to have a coronary calcium score of 1082.  Calcium was evenly distributed basically throughout all 3 vessels.  He has not had any prior cardiac history.   He had a negative stress test in May.  He had a fantastic exercise tolerance.  He had a good heart rate recovery.  He remains very physically active.  He just bought a yellow lab puppy named Porkchop.   The patient denies any new symptoms such as chest discomfort, neck or arm discomfort. There has been no new shortness of breath, PND or orthopnea. There have been no reported palpitations, presyncope or syncope.  He exercises routinely.  He just moved a lot of boxes.    Past Medical History:  Diagnosis Date   Diverticulitis    Hyperlipemia     Past Surgical History:  Procedure Laterality Date   CLOSED REDUCTION FINGER WITH PERCUTANEOUS PINNING Left 09/01/2015   Procedure: ORIF LEFT THUMB;  Surgeon: Bradly Bienenstock, MD;  Location: [facility] OR;  Service: Orthopedics;  Laterality: Left;   COLON SURGERY  2001   Sigmoid colon resection     Current Outpatient Medications  Medication Sig Dispense Refill   Buprenorphine HCl-Naloxone HCl 8-2 MG FILM SMARTSIG:2-3 Strip(s) Sublingual Daily     cholecalciferol (VITAMIN D) 1000 units tablet Take 2,000 Units by mouth daily.     diclofenac (VOLTAREN) 75 MG EC tablet Take 75 mg by mouth 2 (two) times daily as needed.     FIBER COMPLETE PO Take 5 capsules by mouth daily.     Glucosamine-Chondroit-Vit C-Mn (GLUCOSAMINE 1500 COMPLEX PO) Glucosamine     Omega-3 Fatty Acids (FISH OIL) 1000 MG CAPS       rosuvastatin (CRESTOR) 40 MG tablet Take 1 tablet (40 mg total) by mouth daily. 90 tablet 3   Calcium Polycarbophil (FIBER) 625 MG TABS one po tid (Patient not taking: Reported on 02/11/2021)     HYDROmorphone (DILAUDID) 2 MG tablet Take 1 tablet (2 mg total) by mouth every 4 (four) hours as needed for moderate pain or severe pain. (Patient not taking: Reported on 02/11/2021) 32 tablet 0   Omega-3 Fatty Acids (FISH OIL) 1000 MG CAPS Take 1 capsule by mouth daily. (Patient not taking: Reported on 02/11/2021)     No current facility-administered medications for this visit.    Allergies:   Patient has no known allergies.    ROS:  Please see the history of present illness.   Otherwise, review of systems are positive for none.   All other systems are reviewed and negative.    PHYSICAL EXAM: VS:  BP 129/84   Pulse 71   Ht 6\' 2"  (1.88 m)   Wt 209 lb 12.8 oz (95.2 kg)   SpO2 98%   BMI 26.94 kg/m  , BMI Body mass index is 26.94 kg/m. GENERAL:  Well appearing NECK:  No jugular venous distention, waveform within normal limits, carotid upstroke brisk and symmetric, no bruits, no thyromegaly LUNGS:  Clear to auscultation bilaterally CHEST:  Unremarkable HEART:  PMI not displaced  or sustained,S1 and S2 within normal limits, no S3, no S4, no clicks, no rubs, no murmurs ABD:  Flat, positive bowel sounds normal in frequency in pitch, no bruits, no rebound, no guarding, no midline pulsatile mass, no hepatomegaly, no splenomegaly EXT:  2 plus pulses throughout, no edema, no cyanosis no clubbing  EKG:  EKG is not ordered today.  Recent Labs: No results found for requested labs within last 8760 hours.    Lipid Panel No results found for: CHOL, TRIG, HDL, CHOLHDL, VLDL, LDLCALC, LDLDIRECT    Wt Readings from Last 3 Encounters:  02/11/21 209 lb 12.8 oz (95.2 kg)  11/11/20 207 lb 6.4 oz (94.1 kg)  09/01/15 205 lb (93 kg)      Other studies Reviewed: Additional studies/ records that were  reviewed today include: None Review of the above records demonstrates:  Please see elsewhere in the note.     ASSESSMENT AND PLAN:  ELEVATED CORONARY CALCIUM SCORE.    We will continue with aggressive primary risk reduction.  He will let me know if he ever has symptoms to include shortness of breath chest discomfort or decreased exercise tolerance.  HTN: His blood pressure is borderline diastolic and he has placed a blood pressure cuff.  He is going to get back on checking this and we might need a blood pressure therapy if his diastolics are consistently at 80 or below.   DYSLIPIDEMIA: I am going to check now fasting lipid profile and LP(a) since he is on the Crestor.  Goal will be an LDL closer to the 50s.    Current medicines are reviewed at length with the patient today.  The patient does not have concerns regarding medicines.  The following changes have been made:  None  Labs/ tests ordered today include: None  Orders Placed This Encounter  Procedures   Lipoprotein A (LPA)   Lipid panel      Disposition:   FU with in one year.    Signed, Rollene Rotunda, MD  02/11/2021 3:19 PM    [facility]

## 2021-02-11 ENCOUNTER — Encounter: Payer: Self-pay | Admitting: Cardiology

## 2021-02-11 ENCOUNTER — Ambulatory Visit (INDEPENDENT_AMBULATORY_CARE_PROVIDER_SITE_OTHER): Payer: BC Managed Care – PPO | Admitting: Cardiology

## 2021-02-11 ENCOUNTER — Other Ambulatory Visit: Payer: Self-pay

## 2021-02-11 VITALS — BP 129/84 | HR 71 | Ht 74.0 in | Wt 209.8 lb

## 2021-02-11 DIAGNOSIS — I10 Essential (primary) hypertension: Secondary | ICD-10-CM | POA: Diagnosis not present

## 2021-02-11 DIAGNOSIS — R931 Abnormal findings on diagnostic imaging of heart and coronary circulation: Secondary | ICD-10-CM

## 2021-02-11 DIAGNOSIS — E785 Hyperlipidemia, unspecified: Secondary | ICD-10-CM | POA: Diagnosis not present

## 2021-02-11 NOTE — Patient Instructions (Signed)
Medication Instructions:  Your physician recommends that you continue on your current medications as directed. Please refer to the Current Medication list given to you today.  *If you need a refill on your cardiac medications before your next appointment, please call your pharmacy*   Lab Work: FASTING lipid panel & LP(a)  If you have labs (blood work) drawn today and your tests are completely normal, you will receive your results only by: MyChart Message (if you have MyChart) OR A paper copy in the mail If you have any lab test that is abnormal or we need to change your treatment, we will call you to review the results.   Testing/Procedures: NONE   Follow-Up: At [facility], you and your health needs are our priority.  As part of our continuing mission to provide you with exceptional heart care, we have created designated Provider Care Teams.  These Care Teams include your primary Cardiologist (physician) and Advanced Practice Providers (APPs -  Physician Assistants and Nurse Practitioners) who all work together to provide you with the care you need, when you need it.  We recommend signing up for the patient portal called "MyChart".  Sign up information is provided on this After Visit Summary.  MyChart is used to connect with patients for Virtual Visits (Telemedicine).  Patients are able to view lab/test results, encounter notes, upcoming appointments, etc.  Non-urgent messages can be sent to your provider as well.   To learn more about what you can do with MyChart, go to [email or web address].    Your next appointment:   12 month(s)  The format for your next appointment:   In Person  Provider:   You may see Dr. Antoine Poche or one of the following Advanced Practice Providers on your designated Care Team:   Theodore Demark, PA-C Juanda Crumble, PA-C Joni Reining, DNP, ANP   Other Instructions

## 2021-03-07 DIAGNOSIS — F1120 Opioid dependence, uncomplicated: Secondary | ICD-10-CM | POA: Diagnosis not present

## 2021-03-09 DIAGNOSIS — F1120 Opioid dependence, uncomplicated: Secondary | ICD-10-CM | POA: Diagnosis not present

## 2021-03-21 ENCOUNTER — Encounter: Payer: Self-pay | Admitting: *Deleted

## 2021-03-23 DIAGNOSIS — F1120 Opioid dependence, uncomplicated: Secondary | ICD-10-CM | POA: Diagnosis not present

## 2021-03-29 DIAGNOSIS — R931 Abnormal findings on diagnostic imaging of heart and coronary circulation: Secondary | ICD-10-CM | POA: Diagnosis not present

## 2021-03-29 DIAGNOSIS — E785 Hyperlipidemia, unspecified: Secondary | ICD-10-CM | POA: Diagnosis not present

## 2021-03-29 DIAGNOSIS — I10 Essential (primary) hypertension: Secondary | ICD-10-CM | POA: Diagnosis not present

## 2021-03-30 LAB — LIPID PANEL
Chol/HDL Ratio: 2.2 ratio (ref 0.0–5.0)
Cholesterol, Total: 101 mg/dL (ref 100–199)
HDL: 46 mg/dL (ref >39)
LDL Chol Calc (NIH): 38 mg/dL (ref 0–99)
Triglycerides: 85 mg/dL (ref 0–149)
VLDL Cholesterol Cal: 17 mg/dL (ref 5–40)

## 2021-03-30 LAB — LIPOPROTEIN A (LPA): Lipoprotein (a): <8.4 nmol/L (ref <75.0)

## 2021-04-04 ENCOUNTER — Encounter: Payer: Self-pay | Admitting: *Deleted

## 2021-04-06 DIAGNOSIS — F1120 Opioid dependence, uncomplicated: Secondary | ICD-10-CM | POA: Diagnosis not present

## 2021-04-20 DIAGNOSIS — F1120 Opioid dependence, uncomplicated: Secondary | ICD-10-CM | POA: Diagnosis not present

## 2021-05-04 DIAGNOSIS — F1120 Opioid dependence, uncomplicated: Secondary | ICD-10-CM | POA: Diagnosis not present

## 2021-05-18 DIAGNOSIS — F1120 Opioid dependence, uncomplicated: Secondary | ICD-10-CM | POA: Diagnosis not present

## 2021-05-19 ENCOUNTER — Other Ambulatory Visit: Payer: Self-pay | Admitting: Cardiology

## 2021-05-19 DIAGNOSIS — R931 Abnormal findings on diagnostic imaging of heart and coronary circulation: Secondary | ICD-10-CM | POA: Diagnosis not present

## 2021-05-19 DIAGNOSIS — I10 Essential (primary) hypertension: Secondary | ICD-10-CM | POA: Diagnosis not present

## 2021-05-19 DIAGNOSIS — E785 Hyperlipidemia, unspecified: Secondary | ICD-10-CM | POA: Diagnosis not present

## 2021-05-21 LAB — LIPOPROTEIN A (LPA): Lipoprotein (a): 10.3 nmol/L (ref <75.0)

## 2021-05-21 LAB — LIPID PANEL
Chol/HDL Ratio: 3 ratio (ref 0.0–5.0)
Cholesterol, Total: 114 mg/dL (ref 100–199)
HDL: 38 mg/dL — ABNORMAL LOW (ref >39)
LDL Chol Calc (NIH): 58 mg/dL (ref 0–99)
Triglycerides: 90 mg/dL (ref 0–149)
VLDL Cholesterol Cal: 18 mg/dL (ref 5–40)

## 2021-05-27 ENCOUNTER — Encounter: Payer: Self-pay | Admitting: *Deleted

## 2021-05-30 DIAGNOSIS — F1120 Opioid dependence, uncomplicated: Secondary | ICD-10-CM | POA: Diagnosis not present

## 2021-06-01 DIAGNOSIS — F1120 Opioid dependence, uncomplicated: Secondary | ICD-10-CM | POA: Diagnosis not present

## 2021-06-29 DIAGNOSIS — F1120 Opioid dependence, uncomplicated: Secondary | ICD-10-CM | POA: Diagnosis not present

## 2021-07-07 DIAGNOSIS — M1711 Unilateral primary osteoarthritis, right knee: Secondary | ICD-10-CM | POA: Diagnosis not present

## 2021-07-07 DIAGNOSIS — M25561 Pain in right knee: Secondary | ICD-10-CM | POA: Diagnosis not present

## 2021-07-27 DIAGNOSIS — F1120 Opioid dependence, uncomplicated: Secondary | ICD-10-CM | POA: Diagnosis not present

## 2021-08-10 DIAGNOSIS — F1120 Opioid dependence, uncomplicated: Secondary | ICD-10-CM | POA: Diagnosis not present

## 2021-08-15 DIAGNOSIS — F1120 Opioid dependence, uncomplicated: Secondary | ICD-10-CM | POA: Diagnosis not present

## 2021-08-24 DIAGNOSIS — F1120 Opioid dependence, uncomplicated: Secondary | ICD-10-CM | POA: Diagnosis not present

## 2021-09-02 DIAGNOSIS — R739 Hyperglycemia, unspecified: Secondary | ICD-10-CM | POA: Diagnosis not present

## 2021-09-02 DIAGNOSIS — Z125 Encounter for screening for malignant neoplasm of prostate: Secondary | ICD-10-CM | POA: Diagnosis not present

## 2021-09-02 DIAGNOSIS — R82998 Other abnormal findings in urine: Secondary | ICD-10-CM | POA: Diagnosis not present

## 2021-09-02 DIAGNOSIS — E785 Hyperlipidemia, unspecified: Secondary | ICD-10-CM | POA: Diagnosis not present

## 2021-09-05 DIAGNOSIS — E785 Hyperlipidemia, unspecified: Secondary | ICD-10-CM | POA: Diagnosis not present

## 2021-09-05 DIAGNOSIS — Z1212 Encounter for screening for malignant neoplasm of rectum: Secondary | ICD-10-CM | POA: Diagnosis not present

## 2021-09-05 DIAGNOSIS — Z1331 Encounter for screening for depression: Secondary | ICD-10-CM | POA: Diagnosis not present

## 2021-09-05 DIAGNOSIS — Z1389 Encounter for screening for other disorder: Secondary | ICD-10-CM | POA: Diagnosis not present

## 2021-09-05 DIAGNOSIS — Z23 Encounter for immunization: Secondary | ICD-10-CM | POA: Diagnosis not present

## 2021-09-05 DIAGNOSIS — Z0000 Encounter for general adult medical examination without abnormal findings: Secondary | ICD-10-CM | POA: Diagnosis not present

## 2021-09-21 DIAGNOSIS — F1120 Opioid dependence, uncomplicated: Secondary | ICD-10-CM | POA: Diagnosis not present

## 2021-10-05 DIAGNOSIS — F1120 Opioid dependence, uncomplicated: Secondary | ICD-10-CM | POA: Diagnosis not present

## 2021-10-19 DIAGNOSIS — F1120 Opioid dependence, uncomplicated: Secondary | ICD-10-CM | POA: Diagnosis not present

## 2021-10-20 ENCOUNTER — Other Ambulatory Visit: Payer: Self-pay

## 2021-10-20 MED ORDER — ROSUVASTATIN CALCIUM 40 MG PO TABS: 40.0000 mg | ORAL_TABLET | Freq: Every day | ORAL | 3 refills | Status: DC

## 2021-11-02 DIAGNOSIS — F1120 Opioid dependence, uncomplicated: Secondary | ICD-10-CM | POA: Diagnosis not present

## 2021-11-07 DIAGNOSIS — F1120 Opioid dependence, uncomplicated: Secondary | ICD-10-CM | POA: Diagnosis not present

## 2021-11-16 DIAGNOSIS — F1120 Opioid dependence, uncomplicated: Secondary | ICD-10-CM | POA: Diagnosis not present

## 2021-11-30 DIAGNOSIS — F1120 Opioid dependence, uncomplicated: Secondary | ICD-10-CM | POA: Diagnosis not present

## 2021-12-02 DIAGNOSIS — M25561 Pain in right knee: Secondary | ICD-10-CM | POA: Diagnosis not present

## 2021-12-02 DIAGNOSIS — Z79899 Other long term (current) drug therapy: Secondary | ICD-10-CM | POA: Diagnosis not present

## 2021-12-09 DIAGNOSIS — M25561 Pain in right knee: Secondary | ICD-10-CM | POA: Diagnosis not present

## 2022-01-11 DIAGNOSIS — F1120 Opioid dependence, uncomplicated: Secondary | ICD-10-CM | POA: Diagnosis not present

## 2022-01-25 DIAGNOSIS — F1120 Opioid dependence, uncomplicated: Secondary | ICD-10-CM | POA: Diagnosis not present

## 2022-01-30 DIAGNOSIS — F1120 Opioid dependence, uncomplicated: Secondary | ICD-10-CM | POA: Diagnosis not present

## 2022-02-07 DIAGNOSIS — M25561 Pain in right knee: Secondary | ICD-10-CM | POA: Diagnosis not present

## 2022-02-07 DIAGNOSIS — Z79899 Other long term (current) drug therapy: Secondary | ICD-10-CM | POA: Diagnosis not present

## 2022-02-08 DIAGNOSIS — F1120 Opioid dependence, uncomplicated: Secondary | ICD-10-CM | POA: Diagnosis not present

## 2022-03-06 DIAGNOSIS — M25561 Pain in right knee: Secondary | ICD-10-CM | POA: Diagnosis not present

## 2022-03-08 DIAGNOSIS — F1120 Opioid dependence, uncomplicated: Secondary | ICD-10-CM | POA: Diagnosis not present

## 2022-03-22 DIAGNOSIS — F1120 Opioid dependence, uncomplicated: Secondary | ICD-10-CM | POA: Diagnosis not present

## 2022-03-24 DIAGNOSIS — M25561 Pain in right knee: Secondary | ICD-10-CM | POA: Diagnosis not present

## 2022-04-05 NOTE — Progress Notes (Signed)
Office Visit    Patient Name: Curtis Pena Date of Encounter: 04/07/2022  Primary Care Provider:  Rodrigo Ran, MD Primary Cardiologist:  None Primary Electrophysiologist: None  Chief Complaint    Curtis Pena is a 51 y.o. male with PMH of hyperlipidemia, nonobstructive CAD, HTN who presents for 1 year follow-up of elevated calcium score and nonobstructive CAD.  Past Medical History    Past Medical History:  Diagnosis Date   Diverticulitis    Hyperlipemia    Past Surgical History:  Procedure Laterality Date   CLOSED REDUCTION FINGER WITH PERCUTANEOUS PINNING Left 09/01/2015   Procedure: ORIF LEFT THUMB;  Surgeon: Curtis Bienenstock, MD;  Location: [facility] OR;  Service: Orthopedics;  Laterality: Left;   COLON SURGERY  2001   Sigmoid colon resection    Allergies  No Known Allergies  History of Present Illness    Curtis Pena  is a 51 year old male with the above mention past medical history who presents today for annual follow-up of nonobstructive coronary artery disease and elevated calcium score.  He was initially seen by Dr. Antoine Pena on 10/2020 due to abnormal calcium score.  Patient was found to have score of 1082 and calcium distributed through all 3 vessels.  Patient denied any chest pain or shortness of breath.  ETT was ordered revealing no evidence of ischemia with a normal exercise and blood pressure response to procedure.  He was advised to continue risk factor modification with primary prevention.  Patient was advised that if blood pressures remain elevated therapy would be indicated with medication.  Patient is also on Crestor for risk reduction.  Curtis Pena presents today for annual follow-up alone.  Since last being seen in the office patient reports no cardiac complaints or concerns today.  He has been staying quite active with tennis works in [employer], political party with rental properties.  He splits time between [place] and [place] reports eating a plant-based healthy  diet.  His blood pressures today were well controlled at 124/78 and blood pressures recorded since March have been in the 1 teens to 120s systolically over 80s and 90s.  He is tolerating his medications without any reports of myalgias.  He recently had his lipids checked by his PCP and numbers will be obtained for comparison at this visit.  We discussed primary and secondary prevention measures for cardiovascular disease.  Patient had no additional questions at this time.  Patient denies chest pain, palpitations, dyspnea, PND, orthopnea, nausea, vomiting, dizziness, syncope, edema, weight gain, or early satiety.   Home Medications    Current Outpatient Medications  Medication Sig Dispense Refill   Buprenorphine HCl-Naloxone HCl 8-2 MG FILM SMARTSIG:2-3 Strip(s) Sublingual Daily     diclofenac (VOLTAREN) 75 MG EC tablet Take 75 mg by mouth 2 (two) times daily as needed.     FIBER COMPLETE PO Take 5 capsules by mouth daily.     Glucosamine-Chondroit-Vit C-Mn (GLUCOSAMINE 1500 COMPLEX PO) Glucosamine     Omega-3 Fatty Acids (FISH OIL) 1000 MG CAPS Take 1 capsule by mouth daily.     Omega-3 Fatty Acids (FISH OIL) 1000 MG CAPS      rosuvastatin (CRESTOR) 40 MG tablet Take 1 tablet (40 mg total) by mouth daily. 90 tablet 3   No current facility-administered medications for this visit.     Review of Systems  Please see the history of present illness.     All other systems reviewed and are otherwise negative except as noted above.  Physical  Exam    Wt Readings from Last 3 Encounters:  04/07/22 221 lb 9.6 oz (100.5 kg)  02/11/21 209 lb 12.8 oz (95.2 kg)  11/11/20 207 lb 6.4 oz (94.1 kg)   VS: Vitals:   04/07/22 0918 04/07/22 1001  BP: 134/88 124/78  Pulse: 66   SpO2: 95%   ,Body mass index is 28.45 kg/m.  Constitutional:      Appearance: Healthy appearance. Not in distress.  Neck:     Vascular: JVD normal.  Pulmonary:     Effort: Pulmonary effort is normal.     Breath sounds: No  wheezing. No rales. Diminished in the bases Cardiovascular:     Normal rate. Regular rhythm. Normal S1. Normal S2.      Murmurs: There is no murmur.  Edema:    Peripheral edema absent.  Abdominal:     Palpations: Abdomen is soft non tender. There is no hepatomegaly.  Skin:    General: Skin is warm and dry.  Neurological:     General: No focal deficit present.     Mental Status: Alert and oriented to person, place and time.     Cranial Nerves: Cranial nerves are intact.  EKG/LABS/Other Studies Reviewed    ECG personally reviewed by me today -sinus rhythm rate of 66 bpm with no acute changes or findings  No results found for: "WBC", "HGB", "HCT", "MCV", "PLT" No results found for: "CREATININE", "BUN", "NA", "K", "CL", "CO2" No results found for: "ALT", "AST", "GGT", "ALKPHOS", "BILITOT" Lab Results  Component Value Date   CHOL 114 05/19/2021   HDL 38 (L) 05/19/2021   LDLCALC 58 05/19/2021   TRIG 90 05/19/2021   CHOLHDL 3.0 05/19/2021    No results found for: "HGBA1C"  Assessment & Plan    1.  Nonobstructive CAD: - Patient was found to have score of 1082 and calcium distributed through all 3 vessels.  He underwent ETT that revealed no ischemia and low risk with no arrhythmia. -Patient has no cardiac complaints or concerns at this time. -Continue primary prevention with Crestor 40 mg daily  2.  Hypertension: -Patient's blood pressure today was well controlled at 124/78 -Patient advised to continue low-sodium heart healthy diet  3.  Hyperlipidemia: -Patient's last LDL was 58 below goal of less than 70 -Continue Crestor 40 mg daily -Patient encouraged to continue primary prevention measures such as diet and exercise along with medications  Disposition: Follow-up with None or APP in 12 months    Medication Adjustments/Labs and Tests Ordered: Current medicines are reviewed at length with the patient today.  Concerns regarding medicines are outlined above.   Signed, Curtis Pena, Curtis Nestle, NP 04/07/2022, 10:01 AM [facility]  Note:  This document was prepared using Dragon voice recognition software and may include unintentional dictation errors.

## 2022-04-07 ENCOUNTER — Ambulatory Visit: Payer: BC Managed Care – PPO | Attending: Nurse Practitioner | Admitting: Nurse Practitioner

## 2022-04-07 ENCOUNTER — Encounter: Payer: Self-pay | Admitting: Nurse Practitioner

## 2022-04-07 VITALS — BP 124/78 | HR 66 | Ht 74.0 in | Wt 221.6 lb

## 2022-04-07 DIAGNOSIS — I10 Essential (primary) hypertension: Secondary | ICD-10-CM | POA: Diagnosis not present

## 2022-04-07 DIAGNOSIS — E785 Hyperlipidemia, unspecified: Secondary | ICD-10-CM

## 2022-04-07 DIAGNOSIS — R931 Abnormal findings on diagnostic imaging of heart and coronary circulation: Secondary | ICD-10-CM | POA: Diagnosis not present

## 2022-04-07 DIAGNOSIS — I2510 Atherosclerotic heart disease of native coronary artery without angina pectoris: Secondary | ICD-10-CM

## 2022-04-07 NOTE — Patient Instructions (Signed)
Medication Instructions:  NO CHANGES *If you need a refill on your cardiac medications before your next appointment, please call your pharmacy*   Lab Work: NONE If you have labs (blood work) drawn today and your tests are completely normal, you will receive your results only by: [place] (if you have MyChart) OR A paper copy in the mail If you have any lab test that is abnormal or we need to change your treatment, we will call you to review the results.   Testing/Procedures: NONE   Follow-Up: At [facility], you and your health needs are our priority.  As part of our continuing mission to provide you with exceptional heart care, we have created designated Provider Care Teams.  These Care Teams include your primary Cardiologist (physician) and Advanced Practice Providers (APPs -  Physician Assistants and Nurse Practitioners) who all work together to provide you with the care you need, when you need it.  We recommend signing up for the patient portal called "MyChart".  Sign up information is provided on this After Visit Summary.  MyChart is used to connect with patients for Virtual Visits (Telemedicine).  Patients are able to view lab/test results, encounter notes, upcoming appointments, etc.  Non-urgent messages can be sent to your provider as well.   To learn more about what you can do with MyChart, go to [email or web address].    Your next appointment:   1 year(s)  The format for your next appointment:   In Person  Provider:   DR [facility]    Other Instructions NONE  Important Information About Sugar

## 2022-04-19 DIAGNOSIS — F1120 Opioid dependence, uncomplicated: Secondary | ICD-10-CM | POA: Diagnosis not present

## 2022-04-24 DIAGNOSIS — F1120 Opioid dependence, uncomplicated: Secondary | ICD-10-CM | POA: Diagnosis not present

## 2022-04-28 IMAGING — CT CT CARDIAC CORONARY ARTERY CALCIUM SCORE
3 series · 14 of 20 positions shown, 16 images · non-contrast
Comparison: None.

CLINICAL DATA: 49-year-old white male with hyperlipidemia.

EXAM:
CT CARDIAC CORONARY ARTERY CALCIUM SCORE
TECHNIQUE: Non-contrast imaging through the heart was performed using
prospective ECG gating. Image post processing was performed on an
independent workstation, allowing for quantitative analysis of the
heart and coronary arteries. Note that this exam targets the heart
and the chest was not imaged in its entirety.

[Series 2: calcium scoring 2.00 qr36 bestdiast 70% hrt calciu · axial · 0.34mm/px · z∈[+1609,+1693]mm · 4 of 70 slices shown]
[im 14/70  vessel]
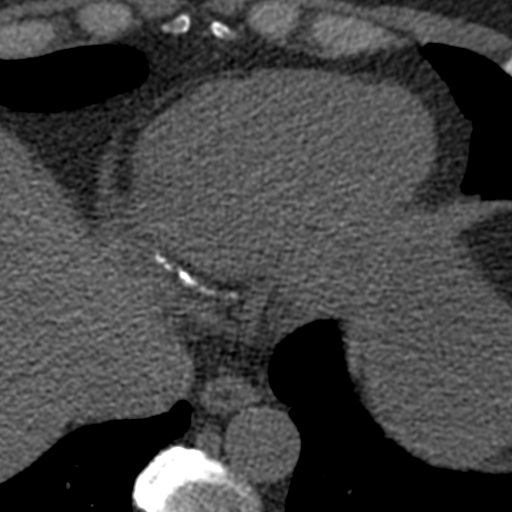
[im 28/70  vessel]
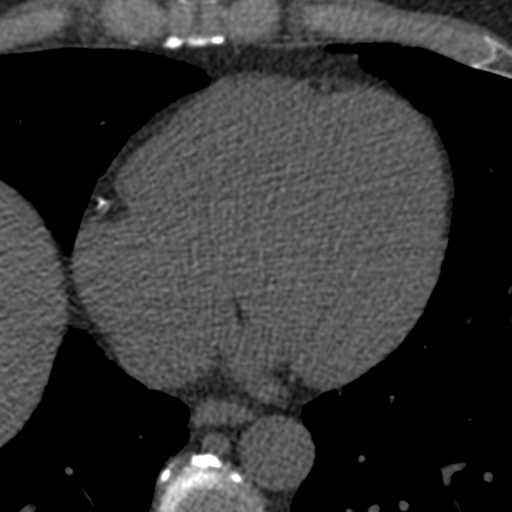
[im 42/70  vessel]
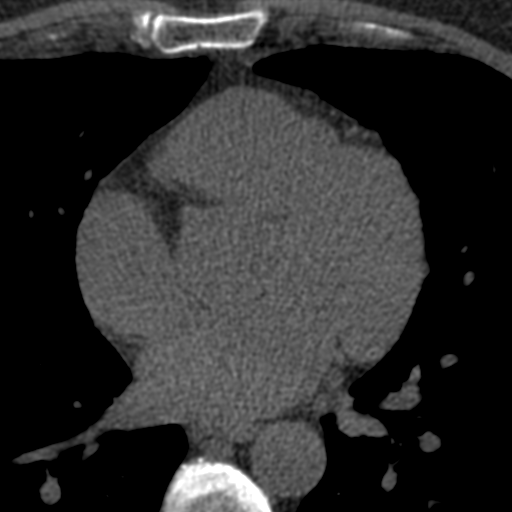
[im 56/70  vessel]
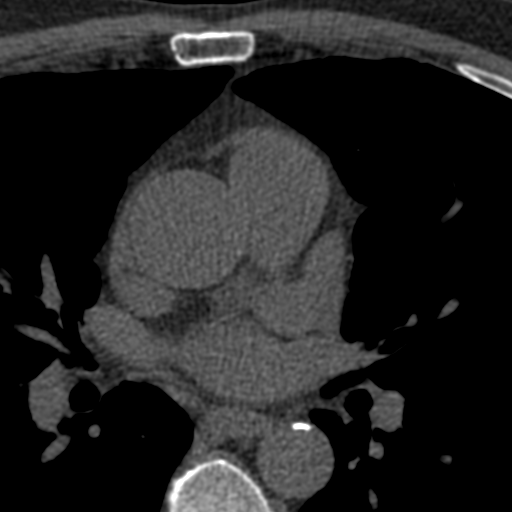

[Series 3: calcium scoring 2.00 br40 bestdiast 70% axial · axial · 0.59mm/px · z∈[+1605,+1697]mm · 5 of 70 slices shown, 7 images]
[im 12/70  vessel]
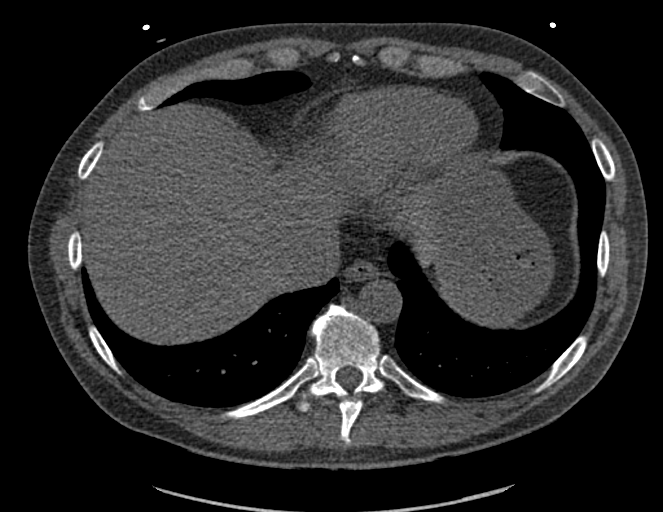
[im 12/70  lung]
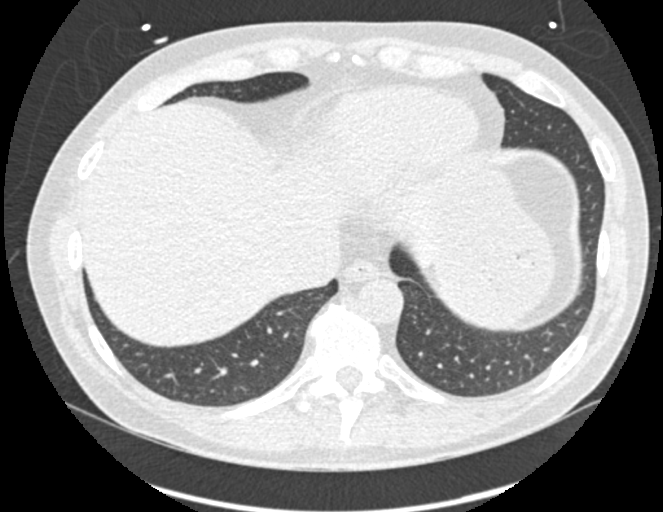
[im 24/70  vessel]
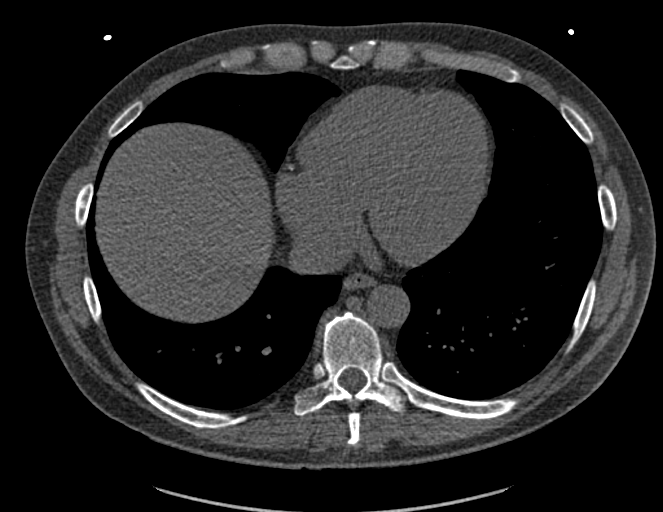
[im 35/70  vessel]
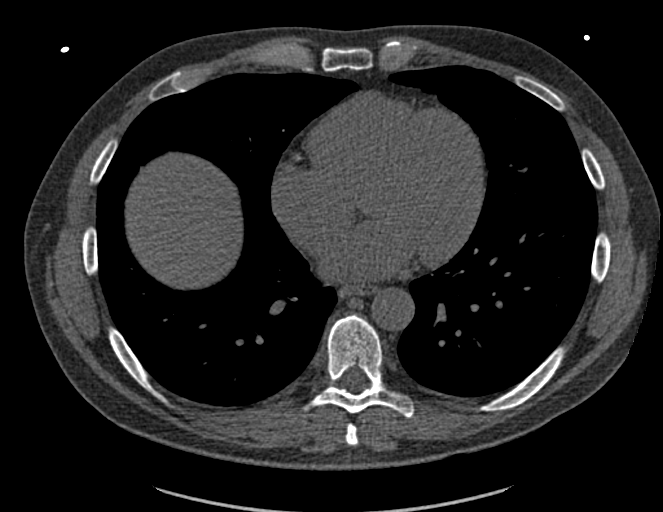
[im 47/70  vessel]
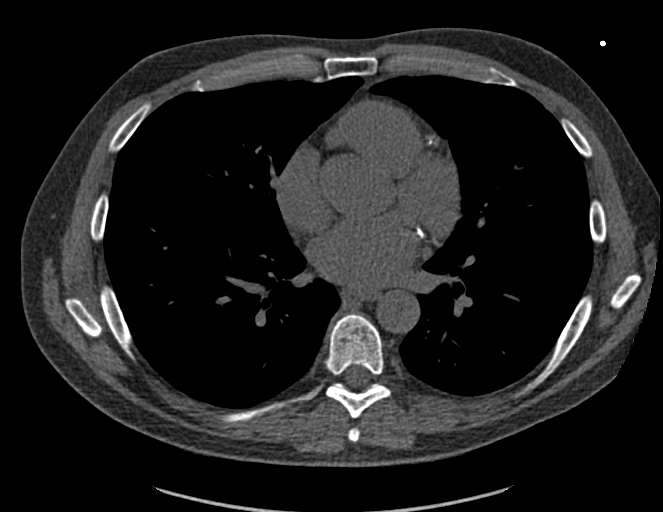
[im 58/70  vessel]
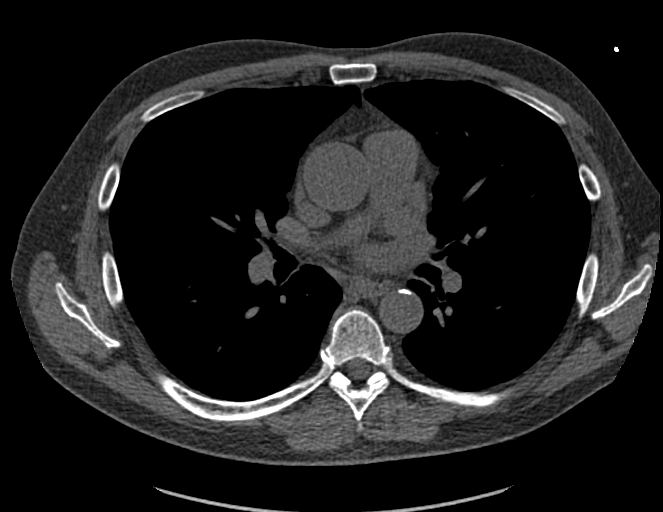
[im 58/70  lung]
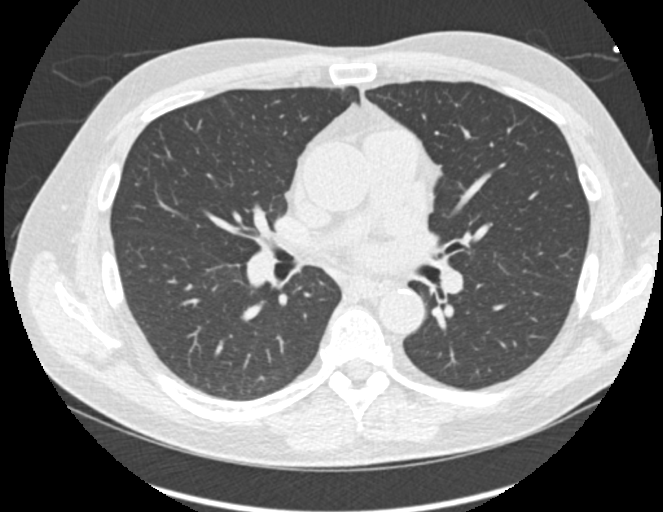

[Series 9: calcium scoring 2.00 br60 bestdiast 70% lungs · axial · 0.59mm/px · z∈[+1605,+1697]mm · 5 of 70 slices shown]
[im 12/70  vessel]
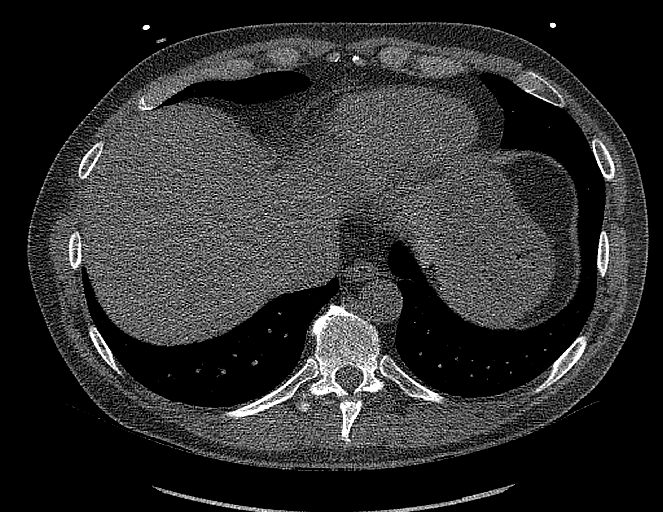
[im 24/70  vessel]
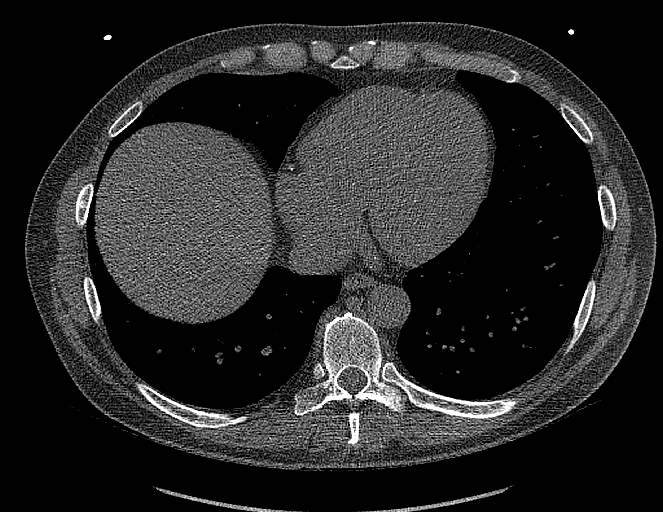
[im 35/70  vessel]
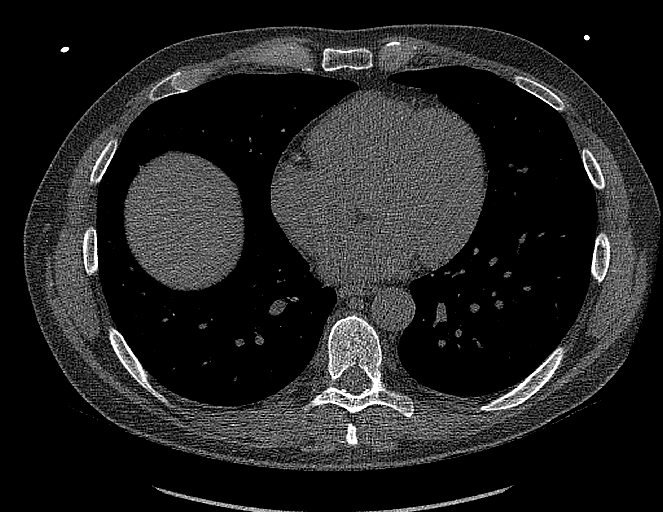
[im 47/70  vessel]
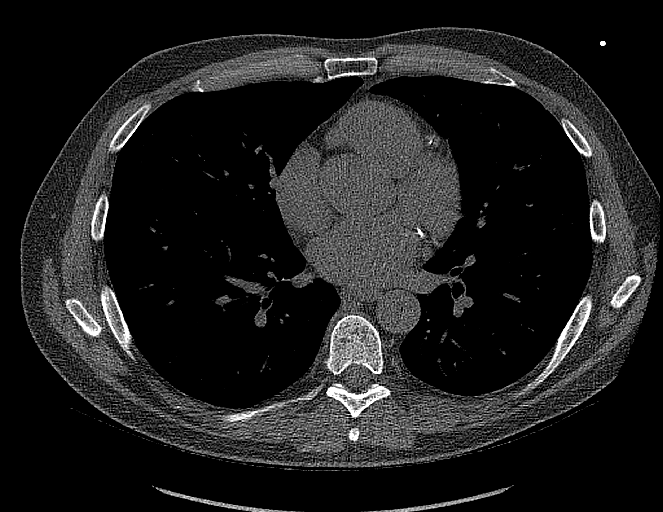
[im 58/70  vessel]
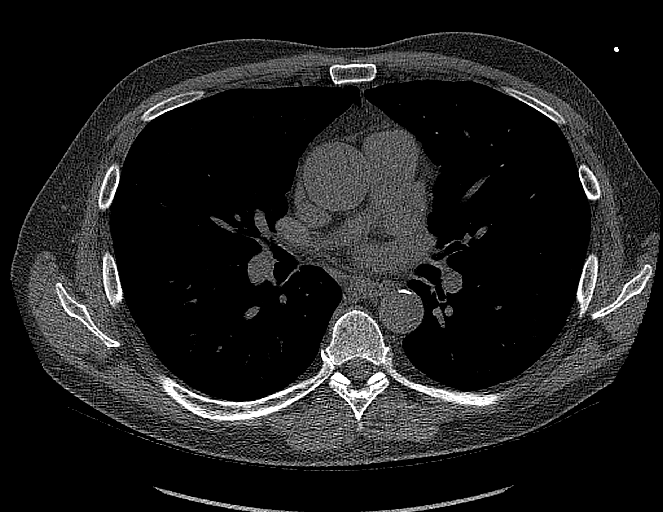

[14 of 20 positions shown; findings below may reference images not displayed]

FINDINGS: CORONARY CALCIUM SCORES:

Left Main: 0

LAD: 360

LCx: 350

RCA: 372

Total Agatston Score: 9694

[HOSPITAL] percentile: 99

AORTA MEASUREMENTS:

Ascending Aorta: 41 mm

Descending Aorta: 27 mm

OTHER FINDINGS:

Calcifications involving the descending thoracic aorta. Heart size
is normal. Visualized mediastinal structures are unremarkable.
Limited evaluation of the upper abdomen due to motion artifact. No
large pleural effusions. Visualized lungs are clear. No acute bone
abnormalities.
IMPRESSION: 1. Coronary calcium score is 9694 and this is at percentile 99 for
patients of the same age, gender and ethnicity.
2. Fusiform aneurysm of the ascending thoracic aorta measuring up to
4.1 cm. Recommend annual imaging followup by CTA or MRA. This
recommendation follows 6909
ACCF/AHA/AATS/ACR/ASA/SCA/BERNHARD/DIARAYE/FENG/SAFACK Guidelines for the
Diagnosis and Management of Patients with Thoracic Aortic Disease.
Circulation. 6909; 121: [id number]. Aortic aneurysm NOS (5GMAS-P6B.K)
3.  Aortic Atherosclerosis (5GMAS-AEF.F).

## 2022-05-03 DIAGNOSIS — F1120 Opioid dependence, uncomplicated: Secondary | ICD-10-CM | POA: Diagnosis not present

## 2022-05-17 DIAGNOSIS — F1120 Opioid dependence, uncomplicated: Secondary | ICD-10-CM | POA: Diagnosis not present

## 2022-05-31 DIAGNOSIS — F1120 Opioid dependence, uncomplicated: Secondary | ICD-10-CM | POA: Diagnosis not present

## 2022-06-28 DIAGNOSIS — M25561 Pain in right knee: Secondary | ICD-10-CM | POA: Diagnosis not present

## 2022-06-28 DIAGNOSIS — F1120 Opioid dependence, uncomplicated: Secondary | ICD-10-CM | POA: Diagnosis not present

## 2022-07-19 DIAGNOSIS — F1120 Opioid dependence, uncomplicated: Secondary | ICD-10-CM | POA: Diagnosis not present

## 2022-07-26 DIAGNOSIS — F1120 Opioid dependence, uncomplicated: Secondary | ICD-10-CM | POA: Diagnosis not present

## 2022-08-09 DIAGNOSIS — F1120 Opioid dependence, uncomplicated: Secondary | ICD-10-CM | POA: Diagnosis not present

## 2022-08-23 DIAGNOSIS — F1120 Opioid dependence, uncomplicated: Secondary | ICD-10-CM | POA: Diagnosis not present

## 2022-09-06 DIAGNOSIS — F1120 Opioid dependence, uncomplicated: Secondary | ICD-10-CM | POA: Diagnosis not present

## 2022-09-29 DIAGNOSIS — Z125 Encounter for screening for malignant neoplasm of prostate: Secondary | ICD-10-CM | POA: Diagnosis not present

## 2022-09-29 DIAGNOSIS — R739 Hyperglycemia, unspecified: Secondary | ICD-10-CM | POA: Diagnosis not present

## 2022-09-29 DIAGNOSIS — N529 Male erectile dysfunction, unspecified: Secondary | ICD-10-CM | POA: Diagnosis not present

## 2022-09-29 DIAGNOSIS — I2510 Atherosclerotic heart disease of native coronary artery without angina pectoris: Secondary | ICD-10-CM | POA: Diagnosis not present

## 2022-09-29 DIAGNOSIS — E785 Hyperlipidemia, unspecified: Secondary | ICD-10-CM | POA: Diagnosis not present

## 2022-09-29 DIAGNOSIS — R7989 Other specified abnormal findings of blood chemistry: Secondary | ICD-10-CM | POA: Diagnosis not present

## 2022-09-29 DIAGNOSIS — Z1212 Encounter for screening for malignant neoplasm of rectum: Secondary | ICD-10-CM | POA: Diagnosis not present

## 2022-10-02 DIAGNOSIS — Z23 Encounter for immunization: Secondary | ICD-10-CM | POA: Diagnosis not present

## 2022-10-02 DIAGNOSIS — F4321 Adjustment disorder with depressed mood: Secondary | ICD-10-CM | POA: Diagnosis not present

## 2022-10-02 DIAGNOSIS — E663 Overweight: Secondary | ICD-10-CM | POA: Diagnosis not present

## 2022-10-02 DIAGNOSIS — E785 Hyperlipidemia, unspecified: Secondary | ICD-10-CM | POA: Diagnosis not present

## 2022-10-02 DIAGNOSIS — Z0000 Encounter for general adult medical examination without abnormal findings: Secondary | ICD-10-CM | POA: Diagnosis not present

## 2022-10-02 DIAGNOSIS — R82998 Other abnormal findings in urine: Secondary | ICD-10-CM | POA: Diagnosis not present

## 2022-10-02 DIAGNOSIS — Z1331 Encounter for screening for depression: Secondary | ICD-10-CM | POA: Diagnosis not present

## 2022-10-02 DIAGNOSIS — R7301 Impaired fasting glucose: Secondary | ICD-10-CM | POA: Diagnosis not present

## 2022-10-04 DIAGNOSIS — F1120 Opioid dependence, uncomplicated: Secondary | ICD-10-CM | POA: Diagnosis not present

## 2022-10-14 ENCOUNTER — Other Ambulatory Visit: Payer: Self-pay | Admitting: Cardiology

## 2022-11-01 DIAGNOSIS — F1120 Opioid dependence, uncomplicated: Secondary | ICD-10-CM | POA: Diagnosis not present

## 2022-11-27 ENCOUNTER — Other Ambulatory Visit: Payer: BC Managed Care – PPO

## 2022-11-27 ENCOUNTER — Encounter: Payer: BC Managed Care – PPO | Admitting: Licensed Clinical Social Worker

## 2022-11-29 DIAGNOSIS — F1120 Opioid dependence, uncomplicated: Secondary | ICD-10-CM | POA: Diagnosis not present

## 2022-12-27 DIAGNOSIS — F1120 Opioid dependence, uncomplicated: Secondary | ICD-10-CM | POA: Diagnosis not present

## 2023-01-04 DIAGNOSIS — M1711 Unilateral primary osteoarthritis, right knee: Secondary | ICD-10-CM | POA: Diagnosis not present

## 2023-01-05 DIAGNOSIS — E785 Hyperlipidemia, unspecified: Secondary | ICD-10-CM | POA: Diagnosis not present

## 2023-01-10 DIAGNOSIS — F1120 Opioid dependence, uncomplicated: Secondary | ICD-10-CM | POA: Diagnosis not present

## 2023-02-07 DIAGNOSIS — F1120 Opioid dependence, uncomplicated: Secondary | ICD-10-CM | POA: Diagnosis not present

## 2023-02-21 DIAGNOSIS — F1120 Opioid dependence, uncomplicated: Secondary | ICD-10-CM | POA: Diagnosis not present

## 2023-03-07 DIAGNOSIS — F1120 Opioid dependence, uncomplicated: Secondary | ICD-10-CM | POA: Diagnosis not present

## 2023-03-28 DIAGNOSIS — F1120 Opioid dependence, uncomplicated: Secondary | ICD-10-CM | POA: Diagnosis not present

## 2023-04-23 DIAGNOSIS — F1121 Opioid dependence, in remission: Secondary | ICD-10-CM | POA: Diagnosis not present

## 2023-05-14 ENCOUNTER — Encounter: Payer: Self-pay | Admitting: Genetic Counselor

## 2023-05-15 ENCOUNTER — Inpatient Hospital Stay: Payer: BC Managed Care – PPO

## 2023-05-15 ENCOUNTER — Encounter: Payer: Self-pay | Admitting: Genetic Counselor

## 2023-05-15 ENCOUNTER — Other Ambulatory Visit: Payer: Self-pay

## 2023-05-15 ENCOUNTER — Inpatient Hospital Stay (HOSPITAL_BASED_OUTPATIENT_CLINIC_OR_DEPARTMENT_OTHER): Payer: BC Managed Care – PPO | Attending: Genetic Counselor | Admitting: Genetic Counselor

## 2023-05-15 ENCOUNTER — Other Ambulatory Visit: Payer: Self-pay | Admitting: Genetic Counselor

## 2023-05-15 DIAGNOSIS — Z801 Family history of malignant neoplasm of trachea, bronchus and lung: Secondary | ICD-10-CM

## 2023-05-15 DIAGNOSIS — Z1379 Encounter for other screening for genetic and chromosomal anomalies: Secondary | ICD-10-CM | POA: Diagnosis not present

## 2023-05-15 LAB — GENETIC SCREENING ORDER

## 2023-05-15 NOTE — Progress Notes (Addendum)
REFERRING PROVIDER: Rodrigo Ran, MD [address] [place],  [place] [address]  PRIMARY PROVIDER:  Rodrigo Ran, MD  PRIMARY REASON FOR VISIT:  1. Family history of lung cancer      HISTORY OF PRESENT ILLNESS:   Curtis Pena, a 52 y.o. male, was seen for a [facility] cancer genetics consultation at the request of Dr. Waynard Edwards due to a family history of lung cancer.  Curtis Pena presents to clinic today to discuss the possibility of a hereditary predisposition to cancer, genetic testing, and to further clarify his future cancer risks, as well as potential cancer risks for family members.   Curtis Pena is a 52 y.o. male with no personal history of cancer.    CANCER HISTORY:  Oncology History   No history exists.    Past Medical History:  Diagnosis Date   Diverticulitis    Family history of lung cancer    Hyperlipemia     Past Surgical History:  Procedure Laterality Date   CLOSED REDUCTION FINGER WITH PERCUTANEOUS PINNING Left 09/01/2015   Procedure: ORIF LEFT THUMB;  Surgeon: Bradly Bienenstock, MD;  Location: [facility] OR;  Service: Orthopedics;  Laterality: Left;   COLON SURGERY  2001   Sigmoid colon resection    FAMILY HISTORY:  We obtained a detailed, 4-generation family history.  Significant diagnoses are listed below: Family History  Problem Relation Age of Onset   Dementia Mother        d. 59   Lung cancer Father 77       d. 54; smoker     The patient does not have children.  He has one sister who is cancer free.  Both parents are deceased.  The patient's mother died with dementia.  Her parents did not have cancer.  The patient's father was a smoker for ~30 years and developed lung cancer at 75.  He had a brother and sister who were cancer free.  There is no other reported family history of cancer.  Curtis Pena is unaware of previous family history of genetic testing for hereditary cancer risks. There is no reported Ashkenazi Jewish ancestry. There is no known  consanguinity.  GENETIC COUNSELING ASSESSMENT: Curtis Pena is a 52 y.o. male with a family history of lung cancer which is somewhat suggestive of a cancer influenced by lifestyle rather than a predisposition to cancer. We, therefore, discussed and recommended the following at today's visit.   DISCUSSION: We discussed that, in general, most cancer is not inherited in families, but instead is sporadic or familial. Sporadic cancers occur by chance and typically happen at older ages (>50 years) as this type of cancer is caused by genetic changes acquired during an individual's lifetime. Some families have more cancers than would be expected by chance; however, the ages or types of cancer are not consistent with a known genetic mutation or known genetic mutations have been ruled out. This type of familial cancer is thought to be due to a combination of multiple genetic, environmental, hormonal, and lifestyle factors. While this combination of factors likely increases the risk of cancer, the exact source of this risk is not currently identifiable or testable.  We discussed that 5 - 10% of cancer is hereditary.  We discussed that lung cancer is most commonly associated with exposures, and rarely hereditary.  Curtis Pena is interested in pursuing genetic testing and other types of screening in order to help identify any health concerns early when they are more treatable. We reviewed the [facility]  proactive genetic testing that looks at 167 genes associated with hereditary cancer, cardiology and other syndromes.  This is a very comprehensive genetic test, however, it is not something that we order.We discussed with Curtis Pena that the family history does not meet insurance or NCCN criteria for genetic testing and, therefore, is not highly consistent with a familial hereditary cancer syndrome.  We feel he is at low risk to harbor a gene mutation associated with such a condition. Regardless of his low risk, Curtis Pena is still  interested in pursuing genetic testing.  We reviewed the characteristics, features and inheritance patterns of hereditary cancer syndromes. We also discussed genetic testing, including the appropriate family members to test, the process of testing, insurance coverage and turn-around-time for results. We discussed the implications of a negative, positive, carrier and/or variant of uncertain significant result. Curtis Pena  was offered a common hereditary cancer panel (36+ genes) and an expanded pan-cancer panel (70+ genes). Curtis Pena was informed of the benefits and limitations of each panel, including that expanded pan-cancer panels contain genes that do not have clear management guidelines at this point in time.  We also discussed that as the number of genes included on a panel increases, the chances of variants of uncertain significance increases. Curtis Pena decided to pursue genetic testing for the CancerNext-Expanded+RNAinsight gene panel.   The CancerNext-Expanded gene panel offered by [facility] and includes sequencing, rearrangement, and RNA analysis for the following 76 genes: AIP, ALK, APC, ATM, AXIN2, BAP1, BARD1, BMPR1A, BRCA1, BRCA2, BRIP1, CDC73, CDH1, CDK4, CDKN1B, CDKN2A, CEBPA, CHEK2, CTNNA1, DDX41, DICER1, ETV6, FH, FLCN, GATA2, LZTR1, MAX, MBD4, MEN1, MET, MLH1, MSH2, MSH3, MSH6, MUTYH, NF1, NF2, NTHL1, PALB2, PHOX2B, PMS2, POT1, PRKAR1A, PTCH1, PTEN, RAD51C, RAD51D, RB1, RET, RUNX1, SDHA, SDHAF2, SDHB, SDHC, SDHD, SMAD4, SMARCA4, SMARCB1, SMARCE1, STK11, SUFU, TMEM127, TP53, TSC1, TSC2, VHL, and WT1 (sequencing and deletion/duplication); EGFR, HOXB13, KIT, MITF, PDGFRA, POLD1, and POLE (sequencing only); EPCAM and GREM1 (deletion/duplication only).   Based on Curtis Pena family history of cancer, he does not meet medical criteria for genetic testing. Curtis Pena is interested in out of pocket genetic testing for $249.  The CancerNext-Expanded gene panel offered by [facility] and  includes sequencing and rearrangement analysis for the following 71 genes: AIP, ALK, APC, ATM, BAP1, BARD1, BMPR1A, BRCA1, BRCA2, BRIP1, CDC73, CDH1, CDK4, CDKN1B, CDKN2A, CHEK2, DICER1, FH, FLCN, KIF1B, LZTR1, MAX, MEN1, MET, MLH1, MSH2, MSH6, MUTYH, NF1, NF2, NTHL1, PALB2, PHOX2B, PMS2, POT1, PRKAR1A, PTCH1, PTEN, RAD51C, RAD51D, RB1, RET, SDHA, SDHAF2, SDHB, SDHC, SDHD, SMAD4, SMARCA4, SMARCB1, SMARCE1, STK11, SUFU, TMEM127, TP53, TSC1, TSC2 and VHL (sequencing and deletion/duplication); AXIN2, CTNNA1, EGFR, EGLN1, HOXB13, KIT, MITF, MSH3, PDGFRA, POLD1 and POLE (sequencing only); EPCAM and GREM1 (deletion/duplication only). RNA data is routinely analyzed for use in variant interpretation for all genes.   PLAN: After considering the risks, benefits, and limitations, Curtis Pena provided informed consent to pursue genetic testing and the blood sample was sent to [facility] for analysis of the CancerNext-Expanded+RNAinsight. Results should be available within approximately 2-3 weeks' time, at which point they will be disclosed by telephone to Curtis Pena, as will any additional recommendations warranted by these results. Curtis Pena will receive a summary of his genetic counseling visit and a copy of his results once available. This information will also be available in Epic.   Lastly, we encouraged Mr. Bitzer to remain in contact with cancer genetics annually so that we can continuously update the family history and  inform him of any changes in cancer genetics and testing that may be of benefit for this family.   Mr. Bamba questions were answered to his satisfaction today. Our contact information was provided should additional questions or concerns arise. Thank you for the referral and allowing [place] to share in the care of your patient.   Cianna Kasparian P. Lowell Guitar, MS, [facility] Licensed, Patent attorney [email or web address] phone: [phone]  The patient was seen for a total of 60  minutes in face-to-face genetic counseling.  The patient was seen alone.  Drs. Meliton Rattan, and/or [place] were available for questions, if needed..    _______________________________________________________________________ For Office Staff:  Number of people involved in session: 1 Was an Intern/ student involved with case: no

## 2023-05-28 DIAGNOSIS — F1121 Opioid dependence, in remission: Secondary | ICD-10-CM | POA: Diagnosis not present

## 2023-05-31 ENCOUNTER — Telehealth: Payer: Self-pay | Admitting: Genetic Counselor

## 2023-05-31 ENCOUNTER — Encounter: Payer: Self-pay | Admitting: Genetic Counselor

## 2023-05-31 DIAGNOSIS — Z1379 Encounter for other screening for genetic and chromosomal anomalies: Secondary | ICD-10-CM | POA: Insufficient documentation

## 2023-05-31 NOTE — Telephone Encounter (Signed)
Revealed negative genetic testing.  Discussed that we do not know why there is cancer in the family. It could be due to a different gene that we are not testing, or maybe our current technology may not be able to pick something up.  It will be important for him to keep in contact with genetics to keep up with whether additional testing may be needed.    

## 2023-06-02 ENCOUNTER — Ambulatory Visit: Payer: Self-pay | Admitting: Genetic Counselor

## 2023-06-02 DIAGNOSIS — Z1379 Encounter for other screening for genetic and chromosomal anomalies: Secondary | ICD-10-CM

## 2023-06-02 NOTE — Progress Notes (Signed)
HPI:  Mr. Zahra was previously seen in the [facility] due to a family history of cancer and concerns regarding a hereditary predisposition to cancer. Please refer to our prior cancer genetics clinic note for more information regarding our discussion, assessment and recommendations, at the time. Mr. Brockhoff recent genetic test results were disclosed to him, as were recommendations warranted by these results. These results and recommendations are discussed in more detail below.  CANCER HISTORY:  Oncology History   No history exists.    FAMILY HISTORY:  We obtained a detailed, 4-generation family history.  Significant diagnoses are listed below: Family History  Problem Relation Age of Onset   Dementia Mother        d. 42   Lung cancer Father 41       d. 71; smoker       The patient does not have children.  He has one sister who is cancer free.  Both parents are deceased.   The patient's mother died with dementia.  Her parents did not have cancer.   The patient's father was a smoker for ~30 years and developed lung cancer at 66.  He had a brother and sister who were cancer free.  There is no other reported family history of cancer.   Mr. Bueso is unaware of previous family history of genetic testing for hereditary cancer risks. There is no reported Ashkenazi Jewish ancestry. There is no known consanguinity.  GENETIC TEST RESULTS: Genetic testing reported out on May 28, 2023 through the CancerNext-Expanded+RNAinsight cancer panel found no pathogenic mutations. The CancerNext-Expanded gene panel offered by [facility] and includes sequencing, rearrangement, and RNA analysis for the following 76 genes: AIP, ALK, APC, ATM, AXIN2, BAP1, BARD1, BMPR1A, BRCA1, BRCA2, BRIP1, CDC73, CDH1, CDK4, CDKN1B, CDKN2A, CEBPA, CHEK2, CTNNA1, DDX41, DICER1, ETV6, FH, FLCN, GATA2, LZTR1, MAX, MBD4, MEN1, MET, MLH1, MSH2, MSH3, MSH6, MUTYH, NF1, NF2, NTHL1, PALB2, PHOX2B, PMS2, POT1,  PRKAR1A, PTCH1, PTEN, RAD51C, RAD51D, RB1, RET, RUNX1, SDHA, SDHAF2, SDHB, SDHC, SDHD, SMAD4, SMARCA4, SMARCB1, SMARCE1, STK11, SUFU, TMEM127, TP53, TSC1, TSC2, VHL, and WT1 (sequencing and deletion/duplication); EGFR, HOXB13, KIT, MITF, PDGFRA, POLD1, and POLE (sequencing only); EPCAM and GREM1 (deletion/duplication only). The test report has been scanned into EPIC and is located under the Molecular Pathology section of the Results Review tab.  A portion of the result report is included below for reference.     We discussed with Mr. Mikrut that because current genetic testing is not perfect, it is possible there may be a gene mutation in one of these genes that current testing cannot detect, but that chance is small.  We also discussed, that there could be another gene that has not yet been discovered, or that we have not yet tested, that is responsible for the cancer diagnoses in the family. It is also possible there is a hereditary cause for the cancer in the family that Mr. Herrman did not inherit and therefore was not identified in his testing.  Therefore, it is important to remain in touch with cancer genetics in the future so that we can continue to offer Mr. Hinzman the most up to date genetic testing.   ADDITIONAL GENETIC TESTING: We discussed with Mr. Fagg that his genetic testing was fairly extensive.  If there are genes identified to increase cancer risk that can be analyzed in the future, we would be happy to discuss and coordinate this testing at that time.    CANCER SCREENING RECOMMENDATIONS: Mr.  Cuff's test result is considered negative (normal).  This means that we have not identified a hereditary cause for his family history of cancer at this time. Most cancers happen by chance and this negative test suggests that his family history of cancer may fall into this category.    Possible reasons for Mr. Haning's negative genetic test include:  1. There may be a gene mutation in one of these genes  that current testing methods cannot detect but that chance is small.  2. There could be another gene that has not yet been discovered, or that we have not yet tested, that is responsible for the cancer diagnoses in the family.  3.  There may be no hereditary risk for cancer in the family. The cancers in Mr. Ivanova and/or his family may be sporadic/familial or due to other genetic and environmental factors. 4. It is also possible there is a hereditary cause for the cancer in the family that Mr. Etris did not inherit.  Therefore, it is recommended he continue to follow the cancer management and screening guidelines provided by his primary healthcare provider. An individual's cancer risk and medical management are not determined by genetic test results alone. Overall cancer risk assessment incorporates additional factors, including personal medical history, family history, and any available genetic information that may result in a personalized plan for cancer prevention and surveillance  RECOMMENDATIONS FOR FAMILY MEMBERS:  Individuals in this family might be at some increased risk of developing cancer, over the general population risk, simply due to the family history of cancer.  We recommended women in this family have a yearly mammogram beginning at age 54, or 66 years younger than the earliest onset of cancer, an annual clinical breast exam, and perform monthly breast self-exams. Women in this family should also have a gynecological exam as recommended by their primary provider. All family members should be referred for colonoscopy starting at age 68, or 37 years younger than the earliest onset of cancer.  FOLLOW-UP: Lastly, we discussed with Mr. Cugini that cancer genetics is a rapidly advancing field and it is possible that new genetic tests will be appropriate for him and/or his family members in the future. We encouraged him to remain in contact with cancer genetics on an annual basis so we can update  his personal and family histories and let him know of advances in cancer genetics that may benefit this family.   Our contact number was provided. Mr. Munsey questions were answered to his satisfaction, and he knows he is welcome to call us at anytime with additional questions or concerns.   Maylon Cos, MS, [facility] Licensed, Certified Genetic Counselor [email or web address]

## 2023-06-18 DIAGNOSIS — F1120 Opioid dependence, uncomplicated: Secondary | ICD-10-CM | POA: Diagnosis not present

## 2023-06-25 DIAGNOSIS — F1121 Opioid dependence, in remission: Secondary | ICD-10-CM | POA: Diagnosis not present

## 2023-08-20 DIAGNOSIS — F1121 Opioid dependence, in remission: Secondary | ICD-10-CM | POA: Diagnosis not present

## 2023-09-10 DIAGNOSIS — F1120 Opioid dependence, uncomplicated: Secondary | ICD-10-CM | POA: Diagnosis not present

## 2023-09-20 DIAGNOSIS — M25561 Pain in right knee: Secondary | ICD-10-CM | POA: Diagnosis not present

## 2023-09-24 DIAGNOSIS — F1121 Opioid dependence, in remission: Secondary | ICD-10-CM | POA: Diagnosis not present

## 2023-10-01 DIAGNOSIS — M25561 Pain in right knee: Secondary | ICD-10-CM | POA: Diagnosis not present

## 2023-11-03 ENCOUNTER — Other Ambulatory Visit: Payer: Self-pay | Admitting: Cardiology

## 2023-11-19 DIAGNOSIS — F1121 Opioid dependence, in remission: Secondary | ICD-10-CM | POA: Diagnosis not present

## 2023-12-03 DIAGNOSIS — F1120 Opioid dependence, uncomplicated: Secondary | ICD-10-CM | POA: Diagnosis not present

## 2023-12-12 NOTE — Progress Notes (Signed)
 Cardiology Clinic Note   Patient Name: Curtis Pena Date of Encounter: 12/14/2023  Primary Care Provider:  Shayne Anes, MD Primary Cardiologist:  None  Patient Profile    Emmitte Surgeon 53 year old male presents to the clinic today for follow-up evaluation of his coronary artery disease and hyperlipidemia.  Past Medical History    Past Medical History:  Diagnosis Date   Diverticulitis    Family history of lung cancer    Hyperlipemia    Past Surgical History:  Procedure Laterality Date   CLOSED REDUCTION FINGER WITH PERCUTANEOUS PINNING Left 09/01/2015   Procedure: ORIF LEFT THUMB;  Surgeon: Prentice Pagan, MD;  Location: [facility] OR;  Service: Orthopedics;  Laterality: Left;   COLON SURGERY  2001   Sigmoid colon resection    Allergies  No Known Allergies  History of Present Illness    Dmarcus Decicco has a PMH of hyperlipidemia, nonobstructive CAD, and hypertension.  He was previously noted to have an elevated coronary calcium  score.  He was seen and evaluated by Dr. Lavona 5/22 due to the abnormal values.  His calcium  score was noted to be 1082.  Calcium  was distributed through all 3 coronary vessels.  He denied chest pain and shortness of breath.  ETT showed no evidence of ischemia, normal exercise and blood pressure response.  Risk modification was advised.  He was continued on rosuvastatin .  He followed up with Jackee Downing, NP on 03/19/2022.  During that time he presented for annual follow-up.  He denied cardiac complaints.  He continued to be active playing tennis.  He was working in Research officer, political party with rental properties.  He was splitting his time between [place] and [place].  He was eating a plant-based diet.  His blood pressure was noted to be 124/78.  He was tolerating his medications well.  He denied side effects.  His cholesterol was managed by his PCP.  He denied chest pain, palpitations, dyspnea, weight gain and early satiety.  He presents to the clinic today  for follow-up evaluation and states he continues to be very active playing tennis daily.  He reports that he does have some soreness after prolonged periods of physical activity.  He asks if this could be related to his rosuvastatin .  We reviewed his medications and previous coronary calcium  scoring.  I reassured him that his soreness is not related to statin therapy.  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  He has been following a heart healthy diet and continues to split his time between [place] and [place].  He had recent lab work drawn with his PCP.  I will request this.  I will continue his current medication regimen and diet.  Will plan follow-up in 12 months.  Today he denies chest pain, shortness of breath, and lower extremity edema.  Home Medications    Prior to Admission medications   Medication Sig Start Date End Date Taking? Authorizing Provider  Buprenorphine HCl-Naloxone HCl 8-2 MG FILM SMARTSIG:2-3 Strip(s) Sublingual Daily 11/05/20   [provider]  diclofenac (VOLTAREN) 75 MG EC tablet Take 75 mg by mouth 2 (two) times daily as needed. 10/15/20   [provider]  FIBER COMPLETE PO Take 5 capsules by mouth daily.    [provider]  Glucosamine-Chondroit-Vit C-Mn (GLUCOSAMINE 1500 COMPLEX PO) Glucosamine    [provider]  Omega-3 Fatty Acids (FISH OIL) 1000 MG CAPS Take 1 capsule by mouth daily.    [provider]  Omega-3 Fatty Acids (FISH OIL) 1000  MG CAPS  04/15/13   [provider]  rosuvastatin  (CRESTOR ) 40 MG tablet TAKE 1 TABLET BY MOUTH EVERY DAY 11/05/23   Lavona Agent, MD    Family History    Family History  Problem Relation Age of Onset   Dementia Mother        d. 38   Lung cancer Father 62       d. 48; smoker   He indicated that his mother is deceased. He indicated that his father is deceased. He indicated that his sister is alive. He indicated that his maternal grandmother is deceased. He  indicated that his maternal grandfather is deceased. He indicated that his paternal grandmother is deceased. He indicated that his paternal grandfather is deceased.  Social History        Review of Systems    General:  No chills, fever, night sweats or weight changes.  Cardiovascular:  No chest pain, dyspnea on exertion, edema, orthopnea, palpitations, paroxysmal nocturnal dyspnea. Dermatological: No rash, lesions/masses Respiratory: No cough, dyspnea Urologic: No hematuria, dysuria Abdominal:   No nausea, vomiting, diarrhea, bright red blood per rectum, melena, or hematemesis Neurologic:  No visual changes, wkns, changes in mental status. All other systems reviewed and are otherwise negative except as noted above.  Physical Exam    VS:  BP 134/80   Pulse 69   Ht 6' 2.5 (1.892 m)   Wt 195 lb 9.6 oz (88.7 kg)   SpO2 99%   BMI 24.78 kg/m  , BMI Body mass index is 24.78 kg/m. GEN: Well nourished, well developed, in no acute distress. HEENT: normal. Neck: Supple, no JVD, carotid bruits, or masses. Cardiac: RRR, no murmurs, rubs, or gallops. No clubbing, cyanosis, edema.  Radials/DP/PT 2+ and equal bilaterally.  Respiratory:  Respirations regular and unlabored, clear to auscultation bilaterally. GI: Soft, nontender, nondistended, BS + x 4. MS: no deformity or atrophy. Skin: warm and dry, no rash. Neuro:  Strength and sensation are intact. Psych: Normal affect.  Accessory Clinical Findings    Recent Labs: No results found for requested labs within last 365 days.   Recent Lipid Panel    Component Value Date/Time   CHOL 114 05/19/2021 1130   TRIG 90 05/19/2021 1130   HDL 38 (L) 05/19/2021 1130   CHOLHDL 3.0 05/19/2021 1130   LDLCALC 58 05/19/2021 1130         ECG personally reviewed by me today- EKG Interpretation Date/Time:  Friday December 14 2023 11:21:28 EDT Ventricular Rate:  66 PR Interval:  162 QRS Duration:  92 QT Interval:  394 QTC Calculation: 413 R  Axis:   64  Text Interpretation: Normal sinus rhythm Normal ECG No previous ECGs available Confirmed by Emelia Hazy [phone]) on 12/14/2023 11:27:56 AM   ETT 11/12/2020 There was no ST segment deviation noted during stress. Exercise time 13 minutes and 28 seconds with normal blood pressure response. Low risk exercise treadmill test with no electrocardiographic evidence of ischemia. No adverse arrhythmias.  Coronary calcium  scoring 10/08/2020  FINDINGS: CORONARY CALCIUM  SCORES:   Left Main: 0   LAD: 360   LCx: 350   RCA: 372   Total Agatston Score: 1082   MESA database percentile: 99   AORTA MEASUREMENTS:   Ascending Aorta: 41 mm   Descending Aorta: 27 mm   OTHER FINDINGS:   Calcifications involving the descending thoracic aorta. Heart size is normal. Visualized mediastinal structures are unremarkable. Limited evaluation of the upper abdomen due to motion artifact. No large  pleural effusions. Visualized lungs are clear. No acute bone abnormalities.   IMPRESSION: 1. Coronary calcium  score is 1082 and this is at percentile 99 for patients of the same age, gender and ethnicity. 2. 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 Recommend annual imaging followup by CTA or MRA. This recommendation follows 2010 ACCF/AHA/AATS/ACR/ASA/SCA/SCAI/SIR/STS/SVM Guidelines for the Diagnosis and Management of Patients with Thoracic Aortic Disease. Circulation. 2010; 121: [id number]. Aortic aneurysm NOS (ICD10-I71.9) 3.  Aortic Atherosclerosis (ICD10-I70.0).      Assessment & Plan   1.  Nonobstructive coronary disease-no chest pain.  Denies episodes of anginal type pain.  Continues to be physically active playing tennis.  Previously noted to have elevated coronary calcium  score 1082 with calcium  noted in all 3 coronary vessels. Continue rosuvastatin  Maintain physical activity Heart healthy low-sodium diet  Hyperlipidemia-LDL 32 on 7/24. High-fiber  diet Continue rosuvastatin , omega-3 fatty acids, fiber supplement Follows with PCP-will request labs  Essential hypertension-BP today 134/80. Maintain blood pressure log Heart healthy low-sodium diet  Disposition: Follow-up with Dr. Lavona or me in 12 months.   Josefa HERO. Nixon Sparr NP-C     12/14/2023, 11:56 AM [facility] [address] Office [phone] Fax [phone]    I spent 14 minutes examining this patient, reviewing medications, and using patient centered shared decision making involving their cardiac care.   I spent  20 minutes reviewing past medical history,  medications, and prior cardiac tests.

## 2023-12-14 ENCOUNTER — Encounter: Payer: Self-pay | Admitting: General Practice

## 2023-12-14 ENCOUNTER — Ambulatory Visit: Attending: General Practice | Admitting: General Practice

## 2023-12-14 VITALS — BP 134/80 | HR 69 | Ht 74.5 in | Wt 195.6 lb

## 2023-12-14 DIAGNOSIS — N401 Enlarged prostate with lower urinary tract symptoms: Secondary | ICD-10-CM | POA: Diagnosis not present

## 2023-12-14 DIAGNOSIS — I2510 Atherosclerotic heart disease of native coronary artery without angina pectoris: Secondary | ICD-10-CM

## 2023-12-14 DIAGNOSIS — I10 Essential (primary) hypertension: Secondary | ICD-10-CM

## 2023-12-14 DIAGNOSIS — E785 Hyperlipidemia, unspecified: Secondary | ICD-10-CM | POA: Diagnosis not present

## 2023-12-14 DIAGNOSIS — R931 Abnormal findings on diagnostic imaging of heart and coronary circulation: Secondary | ICD-10-CM

## 2023-12-14 DIAGNOSIS — R7301 Impaired fasting glucose: Secondary | ICD-10-CM | POA: Diagnosis not present

## 2023-12-14 NOTE — Patient Instructions (Signed)
 Medication Instructions:  NO CHANGES *If you need a refill on your cardiac medications before your next appointment, please call your pharmacy*  Lab Work: NO LABS If you have labs (blood work) drawn today and your tests are completely normal, you will receive your results only by: MyChart Message (if you have MyChart) OR A paper copy in the mail If you have any lab test that is abnormal or we need to change your treatment, we will call you to review the results.  Testing/Procedures: NO TESTING  Follow-Up: At [facility], you and your health needs are our priority.  As part of our continuing mission to provide you with exceptional heart care, our providers are all part of one team.  This team includes your primary Cardiologist (physician) and Advanced Practice Providers or APPs (Physician Assistants and Nurse Practitioners) who all work together to provide you with the care you need, when you need it.  Your next appointment:   1 year(s)  Provider:   Lynwood Schilling, MD or Josefa Beauvais, NP

## 2023-12-17 DIAGNOSIS — E7849 Other hyperlipidemia: Secondary | ICD-10-CM | POA: Diagnosis not present

## 2023-12-17 DIAGNOSIS — I2510 Atherosclerotic heart disease of native coronary artery without angina pectoris: Secondary | ICD-10-CM | POA: Diagnosis not present

## 2023-12-17 DIAGNOSIS — Z818 Family history of other mental and behavioral disorders: Secondary | ICD-10-CM | POA: Diagnosis not present

## 2023-12-17 DIAGNOSIS — Z0000 Encounter for general adult medical examination without abnormal findings: Secondary | ICD-10-CM | POA: Diagnosis not present

## 2023-12-17 DIAGNOSIS — Z1331 Encounter for screening for depression: Secondary | ICD-10-CM | POA: Diagnosis not present

## 2023-12-17 DIAGNOSIS — R82998 Other abnormal findings in urine: Secondary | ICD-10-CM | POA: Diagnosis not present

## 2023-12-24 DIAGNOSIS — F1121 Opioid dependence, in remission: Secondary | ICD-10-CM | POA: Diagnosis not present

## 2024-01-02 DIAGNOSIS — Z1211 Encounter for screening for malignant neoplasm of colon: Secondary | ICD-10-CM | POA: Diagnosis not present

## 2024-01-21 DIAGNOSIS — F1121 Opioid dependence, in remission: Secondary | ICD-10-CM | POA: Diagnosis not present

## 2024-02-02 ENCOUNTER — Other Ambulatory Visit: Payer: Self-pay | Admitting: Cardiology

## 2024-02-06 DIAGNOSIS — M25561 Pain in right knee: Secondary | ICD-10-CM | POA: Diagnosis not present

## 2024-02-25 DIAGNOSIS — F1121 Opioid dependence, in remission: Secondary | ICD-10-CM | POA: Diagnosis not present

## 2024-02-25 DIAGNOSIS — F1120 Opioid dependence, uncomplicated: Secondary | ICD-10-CM | POA: Diagnosis not present

## 2024-04-07 DIAGNOSIS — F1120 Opioid dependence, uncomplicated: Secondary | ICD-10-CM | POA: Diagnosis not present

## 2024-05-19 DIAGNOSIS — F1121 Opioid dependence, in remission: Secondary | ICD-10-CM | POA: Diagnosis not present
